# Patient Record
Sex: Male | Born: 1970 | Hispanic: No | Marital: Married | Smoking: Current some day smoker
Health system: Southern US, Community
[De-identification: ages and names within clinical notes are randomized; demographics above are authoritative.]

## PROBLEM LIST (undated history)

## (undated) DIAGNOSIS — M7660 Achilles tendinitis, unspecified leg: Secondary | ICD-10-CM

## (undated) DIAGNOSIS — M170 Bilateral primary osteoarthritis of knee: Secondary | ICD-10-CM

## (undated) DIAGNOSIS — M25539 Pain in unspecified wrist: Secondary | ICD-10-CM

## (undated) DIAGNOSIS — R000 Tachycardia, unspecified: Secondary | ICD-10-CM

## (undated) DIAGNOSIS — E559 Vitamin D deficiency, unspecified: Secondary | ICD-10-CM

## (undated) DIAGNOSIS — R7303 Prediabetes: Secondary | ICD-10-CM

## (undated) DIAGNOSIS — R7301 Impaired fasting glucose: Secondary | ICD-10-CM

## (undated) DIAGNOSIS — E663 Overweight: Secondary | ICD-10-CM

## (undated) DIAGNOSIS — E538 Deficiency of other specified B group vitamins: Secondary | ICD-10-CM

## (undated) DIAGNOSIS — N529 Male erectile dysfunction, unspecified: Secondary | ICD-10-CM

## (undated) DIAGNOSIS — T7840XA Allergy, unspecified, initial encounter: Secondary | ICD-10-CM

## (undated) DIAGNOSIS — E611 Iron deficiency: Secondary | ICD-10-CM

## (undated) DIAGNOSIS — Q5569 Other congenital malformation of penis: Secondary | ICD-10-CM

## (undated) DIAGNOSIS — J4520 Mild intermittent asthma, uncomplicated: Secondary | ICD-10-CM

## (undated) DIAGNOSIS — J302 Other seasonal allergic rhinitis: Secondary | ICD-10-CM

## (undated) DIAGNOSIS — M25511 Pain in right shoulder: Secondary | ICD-10-CM

## (undated) DIAGNOSIS — Z1211 Encounter for screening for malignant neoplasm of colon: Secondary | ICD-10-CM

## (undated) DIAGNOSIS — M1990 Unspecified osteoarthritis, unspecified site: Secondary | ICD-10-CM

## (undated) DIAGNOSIS — M6528 Calcific tendinitis, other site: Secondary | ICD-10-CM

## (undated) HISTORY — DX: Other seasonal allergic rhinitis: J30.2

## (undated) HISTORY — DX: Calcific tendinitis, other site: M65.28

## (undated) HISTORY — DX: Male erectile dysfunction, unspecified: N52.9

## (undated) HISTORY — DX: Impaired fasting glucose: R73.01

## (undated) HISTORY — DX: Pain in unspecified wrist: M25.539

## (undated) HISTORY — DX: Tachycardia, unspecified: R00.0

## (undated) HISTORY — DX: Bilateral primary osteoarthritis of knee: M17.0

## (undated) HISTORY — DX: Unspecified osteoarthritis, unspecified site: M19.90

## (undated) HISTORY — DX: Overweight: E66.3

## (undated) HISTORY — DX: Prediabetes: R73.03

## (undated) HISTORY — DX: Mild intermittent asthma, uncomplicated: J45.20

## (undated) HISTORY — DX: Vitamin D deficiency, unspecified: E55.9

## (undated) HISTORY — DX: Allergy, unspecified, initial encounter: T78.40XA

## (undated) HISTORY — DX: Encounter for screening for malignant neoplasm of colon: Z12.11

## (undated) HISTORY — DX: Other congenital malformation of penis: Q55.69

## (undated) HISTORY — DX: Deficiency of other specified B group vitamins: E53.8

## (undated) HISTORY — DX: Achilles tendinitis, unspecified leg: M76.60

## (undated) HISTORY — DX: Iron deficiency: E61.1

## (undated) HISTORY — DX: Pain in right shoulder: M25.511

---

## 2004-08-19 ENCOUNTER — Ambulatory Visit: Payer: Self-pay | Admitting: Family Medicine

## 2004-08-26 ENCOUNTER — Ambulatory Visit: Payer: Self-pay | Admitting: Family Medicine

## 2004-11-29 ENCOUNTER — Ambulatory Visit: Payer: Self-pay | Admitting: Family Medicine

## 2005-07-17 ENCOUNTER — Ambulatory Visit: Payer: Self-pay | Admitting: Family Medicine

## 2005-07-18 ENCOUNTER — Ambulatory Visit: Payer: Self-pay | Admitting: Family Medicine

## 2005-07-19 ENCOUNTER — Encounter: Admission: RE | Admit: 2005-07-19 | Discharge: 2005-07-19 | Payer: Self-pay | Admitting: Family Medicine

## 2005-07-19 ENCOUNTER — Ambulatory Visit: Payer: Self-pay | Admitting: Family Medicine

## 2005-07-28 ENCOUNTER — Ambulatory Visit: Payer: Self-pay | Admitting: Internal Medicine

## 2006-01-24 ENCOUNTER — Ambulatory Visit: Payer: Self-pay | Admitting: Family Medicine

## 2006-02-23 ENCOUNTER — Ambulatory Visit: Payer: Self-pay | Admitting: Family Medicine

## 2006-02-23 LAB — CONVERTED CEMR LAB
AST: 27 units/L (ref 0–37)
CO2: 28 meq/L (ref 19–32)
Calcium: 9.4 mg/dL (ref 8.4–10.5)
Chol/HDL Ratio, serum: 5
Cholesterol: 159 mg/dL (ref 0–200)
Creatinine, Ser: 1.1 mg/dL (ref 0.4–1.5)
Glomerular Filtration Rate, Af Am: 97 mL/min/{1.73_m2}
LDL Cholesterol: 103 mg/dL — ABNORMAL HIGH (ref 0–99)
MCV: 81.7 fL (ref 78.0–100.0)
Monocytes Absolute: 0.6 10*3/uL (ref 0.2–0.7)
Neutrophils Relative %: 56 % (ref 43.0–77.0)
Platelets: 207 10*3/uL (ref 150–400)
RBC: 5.22 M/uL (ref 4.22–5.81)
RDW: 12.5 % (ref 11.5–14.6)
Total Bilirubin: 1 mg/dL (ref 0.3–1.2)
Total Protein: 7 g/dL (ref 6.0–8.3)
Triglyceride fasting, serum: 121 mg/dL (ref 0–149)
VLDL: 24 mg/dL (ref 0–40)
WBC: 6.5 10*3/uL (ref 4.5–10.5)

## 2006-03-02 ENCOUNTER — Ambulatory Visit: Payer: Self-pay | Admitting: Family Medicine

## 2006-03-15 ENCOUNTER — Ambulatory Visit: Payer: Self-pay | Admitting: Family Medicine

## 2006-03-15 LAB — CONVERTED CEMR LAB
Testosterone Free: 60.7 pg/mL (ref 47.0–244.0)
Testosterone-% Free: 3.3 % — ABNORMAL HIGH (ref 1.6–2.9)

## 2006-06-18 ENCOUNTER — Ambulatory Visit: Payer: Self-pay | Admitting: Family Medicine

## 2006-09-10 ENCOUNTER — Ambulatory Visit: Payer: Self-pay | Admitting: Family Medicine

## 2006-09-11 LAB — CONVERTED CEMR LAB: Uric Acid, Serum: 7.8 mg/dL — ABNORMAL HIGH (ref 2.4–7.0)

## 2006-09-24 ENCOUNTER — Telehealth: Payer: Self-pay | Admitting: Family Medicine

## 2006-10-23 ENCOUNTER — Ambulatory Visit: Payer: Self-pay | Admitting: Family Medicine

## 2006-10-23 ENCOUNTER — Telehealth: Payer: Self-pay | Admitting: Family Medicine

## 2006-11-21 ENCOUNTER — Telehealth: Payer: Self-pay | Admitting: Family Medicine

## 2006-11-29 ENCOUNTER — Ambulatory Visit: Payer: Self-pay | Admitting: Family Medicine

## 2006-11-29 DIAGNOSIS — M109 Gout, unspecified: Secondary | ICD-10-CM | POA: Insufficient documentation

## 2008-03-23 ENCOUNTER — Ambulatory Visit: Payer: Self-pay | Admitting: Family Medicine

## 2008-07-01 ENCOUNTER — Ambulatory Visit: Payer: Self-pay | Admitting: Family Medicine

## 2010-12-12 ENCOUNTER — Telehealth: Payer: Self-pay | Admitting: Family Medicine

## 2010-12-12 NOTE — Telephone Encounter (Signed)
Patient would like to transfer care from [facility] to [facility] for [place]. Is that okay?

## 2010-12-12 NOTE — Telephone Encounter (Signed)
Fine with me--PM 

## 2010-12-14 ENCOUNTER — Ambulatory Visit: Payer: Self-pay | Admitting: Family Medicine

## 2010-12-15 ENCOUNTER — Encounter: Payer: Self-pay | Admitting: Family Medicine

## 2010-12-16 ENCOUNTER — Ambulatory Visit: Payer: Self-pay | Admitting: Family Medicine

## 2010-12-20 NOTE — Telephone Encounter (Signed)
0k

## 2010-12-22 NOTE — Telephone Encounter (Signed)
Pt has 12/23/10 appt scheduled

## 2010-12-23 ENCOUNTER — Ambulatory Visit: Payer: Self-pay | Admitting: Family Medicine

## 2011-01-06 ENCOUNTER — Encounter: Payer: Self-pay | Admitting: Family Medicine

## 2011-01-06 ENCOUNTER — Ambulatory Visit (INDEPENDENT_AMBULATORY_CARE_PROVIDER_SITE_OTHER): Payer: 59 | Admitting: Family Medicine

## 2011-01-06 DIAGNOSIS — M109 Gout, unspecified: Secondary | ICD-10-CM

## 2011-01-06 DIAGNOSIS — Q5569 Other congenital malformation of penis: Secondary | ICD-10-CM

## 2011-01-06 DIAGNOSIS — Z23 Encounter for immunization: Secondary | ICD-10-CM

## 2011-01-06 DIAGNOSIS — Z0000 Encounter for general adult medical examination without abnormal findings: Secondary | ICD-10-CM

## 2011-01-06 HISTORY — DX: Other congenital malformation of penis: Q55.69

## 2011-01-06 LAB — COMPREHENSIVE METABOLIC PANEL
ALT: 47 U/L (ref 0–53)
AST: 24 U/L (ref 0–37)
Calcium: 9.2 mg/dL (ref 8.4–10.5)
Chloride: 102 mEq/L (ref 96–112)
Creat: 0.95 mg/dL (ref 0.50–1.35)
Potassium: 3.9 mEq/L (ref 3.5–5.3)
Sodium: 139 mEq/L (ref 135–145)

## 2011-01-06 LAB — CBC WITH DIFFERENTIAL/PLATELET
Basophils Absolute: 0 10*3/uL (ref 0.0–0.1)
Eosinophils Absolute: 0.1 10*3/uL (ref 0.0–0.7)
Eosinophils Relative: 2 % (ref 0–5)
HCT: 43.1 % (ref 39.0–52.0)
Lymphs Abs: 2.4 10*3/uL (ref 0.7–4.0)
MCH: 27.9 pg (ref 26.0–34.0)
Monocytes Absolute: 0.6 10*3/uL (ref 0.1–1.0)
Monocytes Relative: 7 % (ref 3–12)
Platelets: 190 10*3/uL (ref 150–400)
WBC: 7.8 10*3/uL (ref 4.0–10.5)

## 2011-01-06 LAB — URIC ACID: Uric Acid, Serum: 8.3 mg/dL — ABNORMAL HIGH (ref 4.0–7.8)

## 2011-01-06 LAB — TSH: TSH: 1.477 u[IU]/mL (ref 0.350–4.500)

## 2011-01-06 MED ORDER — COLCHICINE 0.6 MG PO TABS: ORAL_TABLET | ORAL | Status: DC

## 2011-01-06 NOTE — Assessment & Plan Note (Signed)
Acute flare at 1st cuneiform and first metatarsal articulation. Colcrys 1.2mg  now, then 0.6 one hour later, then 0.6mg  bid x 2d. Call if this doesn't help.  Check uric acid level today. Not having attacks frequent enough to warrant prophylaxis at this point---discussed this with pt and he agrees. Gout diet info reviewed, handout given to pt.

## 2011-01-06 NOTE — Patient Instructions (Signed)
Health Maintenance, Males A healthy lifestyle and preventative care can promote health and wellness.  Maintain regular health, dental, and eye exams.   Eat a healthy diet. Foods like vegetables, fruits, whole grains, low-fat dairy products, and lean protein foods contain the nutrients you need without too many calories. Decrease your intake of foods high in solid fats, added sugars, and salt. Get information about a proper diet from your caregiver, if necessary.   Regular physical exercise is one of the most important things you can do for your health. Most adults should get at least 150 minutes of moderate-intensity exercise (any activity that increases your heart rate and causes you to sweat) each week. In addition, most adults need muscle-strengthening exercises on 2 or more days a week.    Maintain a healthy weight. The body mass index (BMI) is a screening tool to identify possible weight problems. It provides an estimate of body fat based on height and weight. Your caregiver can help determine your BMI, and can help you achieve or maintain a healthy weight. For adults 20 years and older:   A BMI below 18.5 is considered underweight.   A BMI of 18.5 to 24.9 is normal.   A BMI of 25 to 29.9 is considered overweight.   A BMI of 30 and above is considered obese.   Maintain normal blood lipids and cholesterol by exercising and minimizing your intake of saturated fat. Eat a balanced diet with plenty of fruits and vegetables. Blood tests for lipids and cholesterol should begin at age 20 and be repeated every 5 years. If your lipid or cholesterol levels are high, you are over 50, or you are a high risk for heart disease, you may need your cholesterol levels checked more frequently.Ongoing high lipid and cholesterol levels should be treated with medicines, if diet and exercise are not effective.   If you smoke, find out from your caregiver how to quit. If you do not use tobacco, do not start.    If you choose to drink alcohol, do not exceed 2 drinks per day. One drink is considered to be 12 ounces (355 mL) of beer, 5 ounces (148 mL) of wine, or 1.5 ounces (44 mL) of liquor.   Avoid use of street drugs. Do not share needles with anyone. Ask for help if you need support or instructions about stopping the use of drugs.   High blood pressure causes heart disease and increases the risk of stroke. Blood pressure should be checked at least every 1 to 2 years. Ongoing high blood pressure should be treated with medicines if weight loss and exercise are not effective.   If you are 45 to 41 years old, ask your caregiver if you should take aspirin to prevent heart disease.   Diabetes screening involves taking a blood sample to check your fasting blood sugar level. This should be done once every 3 years, after age 45, if you are within normal weight and without risk factors for diabetes. Testing should be considered at a younger age or be carried out more frequently if you are overweight and have at least 1 risk factor for diabetes.   Colorectal cancer can be detected and often prevented. Most routine colorectal cancer screening begins at the age of 50 and continues through age 75. However, your caregiver may recommend screening at an earlier age if you have risk factors for colon cancer. On a yearly basis, your caregiver may provide home test kits to check for hidden   blood in the stool. Use of a small camera at the end of a tube, to directly examine the colon (sigmoidoscopy or colonoscopy), can detect the earliest forms of colorectal cancer. Talk to your caregiver about this at age 50, when routine screening begins. Direct examination of the colon should be repeated every 5 to 10 years through age 75, unless early forms of pre-cancerous polyps or small growths are found.   Healthy men should no longer receive prostate-specific antigen (PSA) blood tests as part of routine cancer screening. Consult with  your caregiver about prostate cancer screening.   Practice safe sex. Use condoms and avoid high-risk sexual practices to reduce the spread of sexually transmitted infections (STIs).   Use sunscreen with a sun protection factor (SPF) of 30 or greater. Apply sunscreen liberally and repeatedly throughout the day. You should seek shade when your shadow is shorter than you. Protect yourself by wearing long sleeves, pants, a wide-brimmed hat, and sunglasses year round, whenever you are outdoors.   Notify your caregiver of new moles or changes in moles, especially if there is a change in shape or color. Also notify your caregiver if a mole is larger than the size of a pencil eraser.   A one-time screening for abdominal aortic aneurysm (AAA) and surgical repair of large AAAs by sound wave imaging (ultrasonography) is recommended for ages 65 to 75 years who are current or former smokers.   Stay current with your immunizations.  Document Released: 08/12/2007 Document Revised: 10/26/2010 Document Reviewed: 07/11/2010 Exit[employer] Patient Information 2012 [employer]. 

## 2011-01-06 NOTE — Progress Notes (Signed)
Office Note 01/06/2011  CC:  Chief Complaint  Patient presents with  . Establish Care    transfer, pt having pain in top of left foot since last night    HPI:  Johnny Atkins is a 40 y.o. [place] male who is here for CPE, transferred from Dr. Tawanna Cooler b/c of convenience to his home. Patient's most recent primary MD: Dr. Tawanna Cooler. Old records in [facility] EMR were reviewed prior to or during today's visit.  Acute complaint: <24h of pain focally on top of left foot, acute onset, severe last night and inhibited sleep some, a bit milder today. Swelling focally just a bit.  No hx of trauma recently.  No other joints bothering him.  No fever, no malaise, no myalgias. Reports hx of gouty arthritis x 2 episodes in about July 30, 2003 or 07/29/04, none since.  Was rx'd prophylactic gout med at that time but he says he never took it--"I don't like taking meds".     Past Medical History  Diagnosis Date  . Arthritis     Primarily knees, mild osteo  . Gout     2 attacks in right MTP joint around 30-Jul-2003  . Low testosterone     History reviewed. No pertinent past surgical history.  Family History  Problem Relation Age of Onset  . Arthritis Mother     osteo (no gout)  . Arthritis Father     osteo (no gout)    History   Social History  . Marital Status: Married    Spouse Name: N/A    Number of Children: N/A  . Years of Education: N/A   Occupational History  . Not on file.   Social History Main Topics  . Smoking status: Current Some Day Smoker    Types: Pipe, Cigars  . Smokeless tobacco: Former Neurosurgeon  . Alcohol Use: Yes  . Drug Use: No  . Sexually Active: Not on file   Other Topics Concern  . Not on file   Social History Narrative   Married, 2 daughters (3 y/o and 52 y/o).Occupation: Sport and exercise psychologist.  Got Master's degree in [place] and also one at [employer].Regular Exercise: 1-2 days per week (elliptical machine 30 min).No cigarettes but occasional cigar/pipe.  Two alcohol drinks per week (beer or  whisky).Born in [place], moved to [place] in [place]30[place]1994.  He is the youngest of 5 siblings.  All siblings healthy but one died in 1998-07-30 of a "stomach problem" rather suddenly.?BCG given in [place]?  Positive PPD here in 2003/07/30, CXR neg--no treatment.    Outpatient Encounter Prescriptions as of 01/06/2011  Medication Sig Dispense Refill  . NON FORMULARY EMERGEN-C powder, use as needed.       . colchicine (COLCRYS) 0.6 MG tablet 2 tabs po now (at onset of gout pain), then 1 tab po one hour later, then 1 tab po bid  7 tablet  2    No Known Allergies  ROS Review of Systems  Constitutional: Negative for fever, chills, appetite change and fatigue.  HENT: Negative for ear pain, congestion, sore throat, neck stiffness and dental problem.   Eyes: Negative for discharge, redness and visual disturbance.  Respiratory: Negative for cough, chest tightness, shortness of breath and wheezing.   Cardiovascular: Negative for chest pain, palpitations and leg swelling.  Gastrointestinal: Negative for nausea, vomiting, abdominal pain, diarrhea and blood in stool.  Genitourinary: Positive for penile pain (frenulum thickening/irritation on and off (uncircumcised)). Negative for dysuria, urgency, frequency, hematuria, flank pain, difficulty urinating and testicular  pain.  Musculoskeletal: Negative for myalgias, back pain, joint swelling and arthralgias.  Skin: Negative for pallor and rash.  Neurological: Negative for dizziness, speech difficulty, weakness and headaches.  Hematological: Negative for adenopathy. Does not bruise/bleed easily.  Psychiatric/Behavioral: Negative for confusion, sleep disturbance and dysphoric mood. The patient is not nervous/anxious.     PE; Blood pressure 125/85, pulse 97, temperature 98.4 F (36.9 C), temperature source Oral, height 5\' 8"  (1.727 m), weight 193 lb (87.544 kg), SpO2 97.00%. Gen: Alert, well appearing.  Patient is oriented to person, place, time, and situation. ENT: Ears: EACs clear,  normal epithelium.  TMs with good light reflex and landmarks bilaterally.  Eyes: no injection, icteris, swelling, or exudate.  EOMI, PERRLA. Nose: no drainage or turbinate edema/swelling.  No injection or focal lesion.  Mouth: lips without lesion/swelling.  Oral mucosa pink and moist.  Dentition intact and without obvious caries or gingival swelling.  Oropharynx without erythema, exudate, or swelling.  Neck - No masses or thyromegaly or limitation in range of motion Chest with symmetric expansion, good aeration, nonlabored respirations.  Clear and equal breath sounds in all lung fields.  No clubbing or cyanosis. RRR, without murmur, rub, or gallop. Carotid pulses easily palpable, symmetric pulsations, no bruit or palpable dilatation. Radial, femoral, and ankle pulses easily palpable and symmetric. No abdominal bruit. No varicosities.  Cap refill in extremities is brisk. ABD: soft, NT, ND, BS normal.  No hepatospenomegaly or mass.  No bruits. EXT: no clubbing, cyanosis, or edema.  Genitals normal; both testes normal without tenderness, masses, hydroceles, varicoceles, erythema or swelling. Shaft normal, uncircumcised, meatus normal without discharge. His ventral frenulum has mild thickening at its attachment to the base of the glans. No inguinal hernia noted. No inguinal lymphadenopathy.  Pertinent labs:  None today  ASSESSMENT AND PLAN:   GOUT Acute flare at 1st cuneiform and first metatarsal articulation. Colcrys 1.2mg  now, then 0.6 one hour later, then 0.6mg  bid x 2d. Call if this doesn't help.  Check uric acid level today. Not having attacks frequent enough to warrant prophylaxis at this point---discussed this with pt and he agrees. Gout diet info reviewed, handout given to pt.  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 Recommended he see urologist to discuss circumcision. He says he saw one a couple of years ago and circumcision was  recommended. We gave him the phone number of [facility] b/c they said he could simply call and make appt himself, no new referral needed.  Health maintenance examination His only issue is his weight (BMI 29).  We discussed getting more aggressive with exercise and diet, goal of 1 lb of wt loss per week, emphasized need to make sustainable lifestyle changes. Reviewed age and gender appropriate health maintenance issues (prudent diet, regular exercise, health risks of tobacco and excessive alcohol, use of seatbelts, fire alarms in home, use of sunscreen).  Also reviewed age and gender appropriate health screening as well as vaccine recommendations. Flu vaccine IM given today. He was not fasting today so we'll have him return at his convenience in the next couple of weeks to get FLP.     Return in about 1 year (around 01/06/2012) for cpe.

## 2011-01-06 NOTE — Assessment & Plan Note (Signed)
His only issue is his weight (BMI 29).  We discussed getting more aggressive with exercise and diet, goal of 1 lb of wt loss per week, emphasized need to make sustainable lifestyle changes. Reviewed age and gender appropriate health maintenance issues (prudent diet, regular exercise, health risks of tobacco and excessive alcohol, use of seatbelts, fire alarms in home, use of sunscreen).  Also reviewed age and gender appropriate health screening as well as vaccine recommendations. Flu vaccine IM given today. He was not fasting today so we'll have him return at his convenience in the next couple of weeks to get FLP.

## 2011-01-06 NOTE — Assessment & Plan Note (Signed)
He is not circumsized, his frenulum does bother him. Recommended he see urologist to discuss circumcision. He says he saw one a couple of years ago and circumcision was recommended. We gave him the phone number of [facility] b/c they said he could simply call and make appt himself, no new referral needed.

## 2011-01-11 ENCOUNTER — Other Ambulatory Visit (INDEPENDENT_AMBULATORY_CARE_PROVIDER_SITE_OTHER): Payer: 59

## 2011-01-11 DIAGNOSIS — Z0000 Encounter for general adult medical examination without abnormal findings: Secondary | ICD-10-CM

## 2011-01-11 LAB — LIPID PANEL
Cholesterol: 151 mg/dL (ref 0–200)
HDL: 36.2 mg/dL — ABNORMAL LOW (ref >39.00)
Total CHOL/HDL Ratio: 4
VLDL: 21.2 mg/dL (ref 0.0–40.0)

## 2011-02-24 ENCOUNTER — Ambulatory Visit (INDEPENDENT_AMBULATORY_CARE_PROVIDER_SITE_OTHER): Payer: 59 | Admitting: Family Medicine

## 2011-02-24 ENCOUNTER — Encounter: Payer: Self-pay | Admitting: Family Medicine

## 2011-02-24 VITALS — BP 119/83 | HR 99 | Temp 97.8°F | Wt 193.0 lb

## 2011-02-24 DIAGNOSIS — J40 Bronchitis, not specified as acute or chronic: Secondary | ICD-10-CM

## 2011-02-24 MED ORDER — HYDROCODONE-HOMATROPINE 5-1.5 MG/5ML PO SYRP: ORAL_SOLUTION | ORAL | Status: AC

## 2011-02-24 MED ORDER — ALBUTEROL SULFATE (2.5 MG/3ML) 0.083% IN NEBU
2.5000 mg | INHALATION_SOLUTION | Freq: Once | RESPIRATORY_TRACT | Status: AC
  Administered 2011-02-24: 2.5 mg via RESPIRATORY_TRACT

## 2011-02-24 MED ORDER — ALBUTEROL SULFATE HFA 108 (90 BASE) MCG/ACT IN AERS: INHALATION_SPRAY | RESPIRATORY_TRACT | Status: DC

## 2011-02-24 NOTE — Progress Notes (Signed)
OFFICE NOTE  02/24/2011  CC:  Chief Complaint  Patient presents with  . Cough    since Sunday, no fever, entire family sick     HPI: Patient is a 40 y.o. [place] male who is here for cough. Pt presents complaining of respiratory symptoms for 5-6 days.  Mostly nasal congestion/runny nose, sneezing, and PND cough.  Worst symptoms seems to be the cough, chest feeling tight and a bit harder to breath.  Lately the symptoms seem to be worsening.  No fevers, no wheezing.  No pain in face or teeth.  No significant HA.  No ST or HA.  Symptoms made worse by night time, supine position.  Symptoms improved by dimetapp. Smoker? no Recent sick contact? Yes, wife with similar illness Muscle or joint aches? Mild ( back)  ROS: no n/v/d or abdominal pain.  No rash.  No neck stiffness.   +Mild fatigue.  +Mild appetite loss.   Pertinent PMH:  Hx of asthmatic bronchitis x 1 in the past that he recalls having prednisone for. Gout No hx of smoking cigarettes.  Pertinent Meds: dimetapp, colchicine  PE: Blood pressure 119/83, pulse 99, temperature 97.8 F (36.6 C), temperature source Temporal, weight 193 lb (87.544 kg), SpO2 96.00%. VS: noted--normal. Gen: alert, NAD, NONTOXIC APPEARING. HEENT: eyes without injection, drainage, or swelling.  Ears: EACs clear, TMs with normal light reflex and landmarks.  Nose: Clear rhinorrhea, with some dried, crusty exudate adherent to mildly injected mucosa.  No purulent d/c.  No paranasal sinus TTP.  No facial swelling.  Throat and mouth without focal lesion.  No pharyngial swelling, erythema, or exudate.   Neck: supple, no LAD.   LUNGS: CTA bilat, nonlabored resps.  Frequent post-exhalational coughing.   CV: RRR, no m/r/g. EXT: no c/c/e SKIN: no rash    IMPRESSION AND PLAN: Viral URI with asthmatic bronchitis. Albuterol neb 2.5mg  in office today: improved aeration, less coughing.  Ventolin HFA 1-2 puffs q6h prn rx'd, also hycodan cough syrup 1-2 tsp q6h  prn cough.   Hold off on systemic steroids for now.  If not improving any in 3-4 days will likely add prednisone on at that time.  FOLLOW UP: prn

## 2011-07-08 ENCOUNTER — Ambulatory Visit: Payer: 59 | Admitting: Family Medicine

## 2011-07-10 ENCOUNTER — Encounter: Payer: Self-pay | Admitting: Family Medicine

## 2011-07-10 ENCOUNTER — Ambulatory Visit (INDEPENDENT_AMBULATORY_CARE_PROVIDER_SITE_OTHER): Payer: 59 | Admitting: Family Medicine

## 2011-07-10 VITALS — BP 126/85 | HR 76 | Temp 97.9°F | Ht 68.0 in | Wt 191.0 lb

## 2011-07-10 DIAGNOSIS — M109 Gout, unspecified: Secondary | ICD-10-CM

## 2011-07-10 MED ORDER — ALLOPURINOL 300 MG PO TABS: 300.0000 mg | ORAL_TABLET | Freq: Every day | ORAL | Status: DC

## 2011-07-10 MED ORDER — PREDNISONE 20 MG PO TABS: ORAL_TABLET | ORAL | Status: DC

## 2011-07-10 MED ORDER — COLCHICINE 0.6 MG PO TABS: ORAL_TABLET | ORAL | Status: DC

## 2011-07-10 NOTE — Progress Notes (Signed)
OFFICE NOTE  07/10/2011  CC:  Chief Complaint  Patient presents with  . Gout    flare on Friday, took colchicine over weekend.  still having pain.     HPI: Patient is a 41 y.o. [place] male who is here for joint pain/gout episode. Right foot great toe MTP started hurting, swelling, turned red--middle of night 2 nights ago. He took 2 colchicine then, then 1 colch an hour later, then the next day started 1 colch bid and this finished his 7 pill rx this morning.  He is feeling 50% better.  No f/c/malaise. He had an episode 38mo ago that the colchicine took care of fairly well--same joint. He is not currently on uric acid-lowering med but is ready to start one now.   Pertinent PMH:  Past Medical History  Diagnosis Date  . Arthritis     Primarily knees, mild osteo  . Gout     2 attacks in right MTP joint around 2005  . Low testosterone   . Wrist pain     For 2-3 years in his 30s.  Extensive multidisciplinary w/u at [facility] and [facility] revealed NO ABNORMALITY.  Improved with ibuprofen and essentially has spontaneously resolved.    MEDS:  Outpatient Prescriptions Prior to Visit  Medication Sig Dispense Refill  . albuterol (VENTOLIN HFA) 108 (90 BASE) MCG/ACT inhaler 1-2 puffs q6h prn cough/chest tightness/wheezing/shortness of breath  1 Inhaler  0  . colchicine (COLCRYS) 0.6 MG tablet 2 tabs po now (at onset of gout pain), then 1 tab po one hour later, then 1 tab po bid  7 tablet  2    PE: Blood pressure 126/85, pulse 76, temperature 97.9 F (36.6 C), temperature source Temporal, height 5\' 8"  (1.727 m), weight 191 lb (86.637 kg). Gen: Alert, well appearing.  Patient is oriented to person, place, time, and situation. Right foot 1st MTP joint erythematous, warm, tender.  IMPRESSION AND PLAN:  Podagra Acute flare: 50% better on th 7 tab "burst" of colchicine over the w/e. This is getting more frequent so we'll start allopurinol today. Will start allopurinol 300mg  qd, also continue  colchicine 0.6mg  bid for 38mo during the start-up period of allopurinol. Gave prednisone 20mg , 1 tab bid x 5d --rx to hold for future if acute flare comes on again and it is w/e or the colchicine has not been effective in the "flare" dosing. F/u in office in 1 mo to see how he's doing and will recheck uric acid again at that time. We have gone over diet in the past, reviewed again--maybe alcohol consumption this w/e played a role.  He is a vegetarian and does eat a lot of beans as protein source.       FOLLOW UP: 38mo

## 2011-07-10 NOTE — Assessment & Plan Note (Signed)
Acute flare: 50% better on th 7 tab "burst" of colchicine over the w/e. This is getting more frequent so we'll start allopurinol today. Will start allopurinol 300mg  qd, also continue colchicine 0.6mg  bid for 23mo during the start-up period of allopurinol. Gave prednisone 20mg , 1 tab bid x 5d --rx to hold for future if acute flare comes on again and it is w/e or the colchicine has not been effective in the "flare" dosing. F/u in office in 1 mo to see how he's doing and will recheck uric acid again at that time. We have gone over diet in the past, reviewed again--maybe alcohol consumption this w/e played a role.  He is a vegetarian and does eat a lot of beans as protein source.

## 2012-01-11 ENCOUNTER — Encounter: Payer: Self-pay | Admitting: Family Medicine

## 2012-01-12 ENCOUNTER — Ambulatory Visit (INDEPENDENT_AMBULATORY_CARE_PROVIDER_SITE_OTHER): Payer: 59 | Admitting: Family Medicine

## 2012-01-12 ENCOUNTER — Encounter: Payer: Self-pay | Admitting: Family Medicine

## 2012-01-12 VITALS — BP 130/88 | HR 91 | Temp 97.4°F | Ht 68.0 in | Wt 194.0 lb

## 2012-01-12 DIAGNOSIS — Z0000 Encounter for general adult medical examination without abnormal findings: Secondary | ICD-10-CM

## 2012-01-12 DIAGNOSIS — J069 Acute upper respiratory infection, unspecified: Secondary | ICD-10-CM | POA: Insufficient documentation

## 2012-01-12 DIAGNOSIS — M109 Gout, unspecified: Secondary | ICD-10-CM

## 2012-01-12 MED ORDER — HYDROCODONE-HOMATROPINE 5-1.5 MG/5ML PO SYRP: ORAL_SOLUTION | ORAL | Status: DC

## 2012-01-12 MED ORDER — BENZONATATE 200 MG PO CAPS: 200.0000 mg | ORAL_CAPSULE | Freq: Two times a day (BID) | ORAL | Status: DC | PRN

## 2012-01-12 NOTE — Assessment & Plan Note (Signed)
Discussed symptomatic care and pt says he tolerates zyrtec but really nothing else OTC as far as cold/cough, so I agreed to call in tessalon perles 200mg  bid prn for daytime cough--take zyrtec with this. I also rx'd hycodan for bedtime use for cough.  Discussed warning signs of RAD and worsening resp illness and he'll call or return if any of these occur.

## 2012-01-12 NOTE — Assessment & Plan Note (Addendum)
Reviewed age and gender appropriate health maintenance issues (prudent diet, regular exercise, health risks of tobacco and excessive alcohol, use of seatbelts, fire alarms in home, use of sunscreen).  Also reviewed age and gender appropriate health screening as well as vaccine recommendations. Flu vaccine IM today.  He'll return for fasting health panel and uric acid level ASAP. Discussed wt management he'll work on doing more vigorous exercise for a longer period of time.  He'll also work some on decreasing carbs, increasing veggies and fiber-rich fruits.  Goal of 1 lb wt loss per week, goal final wt of around 175 lbs.  Offered nutritionist referral but he declined.

## 2012-01-12 NOTE — Patient Instructions (Signed)
Health Maintenance, Males A healthy lifestyle and preventative care can promote health and wellness.  Maintain regular health, dental, and eye exams.  Eat a healthy diet. Foods like vegetables, fruits, whole grains, low-fat dairy products, and lean protein foods contain the nutrients you need without too many calories. Decrease your intake of foods high in solid fats, added sugars, and salt. Get information about a proper diet from your caregiver, if necessary.  Regular physical exercise is one of the most important things you can do for your health. Most adults should get at least 150 minutes of moderate-intensity exercise (any activity that increases your heart rate and causes you to sweat) each week. In addition, most adults need muscle-strengthening exercises on 2 or more days a week.   Maintain a healthy weight. The body mass index (BMI) is a screening tool to identify possible weight problems. It provides an estimate of body fat based on height and weight. Your caregiver can help determine your BMI, and can help you achieve or maintain a healthy weight. For adults 20 years and older:  A BMI below 18.5 is considered underweight.  A BMI of 18.5 to 24.9 is normal.  A BMI of 25 to 29.9 is considered overweight.  A BMI of 30 and above is considered obese.  Maintain normal blood lipids and cholesterol by exercising and minimizing your intake of saturated fat. Eat a balanced diet with plenty of fruits and vegetables. Blood tests for lipids and cholesterol should begin at age 20 and be repeated every 5 years. If your lipid or cholesterol levels are high, you are over 50, or you are a high risk for heart disease, you may need your cholesterol levels checked more frequently.Ongoing high lipid and cholesterol levels should be treated with medicines, if diet and exercise are not effective.  If you smoke, find out from your caregiver how to quit. If you do not use tobacco, do not start.  If you  choose to drink alcohol, do not exceed 2 drinks per day. One drink is considered to be 12 ounces (355 mL) of beer, 5 ounces (148 mL) of wine, or 1.5 ounces (44 mL) of liquor.  Avoid use of street drugs. Do not share needles with anyone. Ask for help if you need support or instructions about stopping the use of drugs.  High blood pressure causes heart disease and increases the risk of stroke. Blood pressure should be checked at least every 1 to 2 years. Ongoing high blood pressure should be treated with medicines if weight loss and exercise are not effective.  If you are 45 to 42 years old, ask your caregiver if you should take aspirin to prevent heart disease.  Diabetes screening involves taking a blood sample to check your fasting blood sugar level. This should be done once every 3 years, after age 45, if you are within normal weight and without risk factors for diabetes. Testing should be considered at a younger age or be carried out more frequently if you are overweight and have at least 1 risk factor for diabetes.  Colorectal cancer can be detected and often prevented. Most routine colorectal cancer screening begins at the age of 50 and continues through age 75. However, your caregiver may recommend screening at an earlier age if you have risk factors for colon cancer. On a yearly basis, your caregiver may provide home test kits to check for hidden blood in the stool. Use of a small camera at the end of a tube,   to directly examine the colon (sigmoidoscopy or colonoscopy), can detect the earliest forms of colorectal cancer. Talk to your caregiver about this at age 50, when routine screening begins. Direct examination of the colon should be repeated every 5 to 10 years through age 75, unless early forms of pre-cancerous polyps or small growths are found.  Hepatitis C blood testing is recommended for all people born from 1945 through 1965 and any individual with known risks for hepatitis C.  Healthy  men should no longer receive prostate-specific antigen (PSA) blood tests as part of routine cancer screening. Consult with your caregiver about prostate cancer screening.  Testicular cancer screening is not recommended for adolescents or adult males who have no symptoms. Screening includes self-exam, caregiver exam, and other screening tests. Consult with your caregiver about any symptoms you have or any concerns you have about testicular cancer.  Practice safe sex. Use condoms and avoid high-risk sexual practices to reduce the spread of sexually transmitted infections (STIs).  Use sunscreen with a sun protection factor (SPF) of 30 or greater. Apply sunscreen liberally and repeatedly throughout the day. You should seek shade when your shadow is shorter than you. Protect yourself by wearing long sleeves, pants, a wide-brimmed hat, and sunglasses year round, whenever you are outdoors.  Notify your caregiver of new moles or changes in moles, especially if there is a change in shape or color. Also notify your caregiver if a mole is larger than the size of a pencil eraser.  A one-time screening for abdominal aortic aneurysm (AAA) and surgical repair of large AAAs by sound wave imaging (ultrasonography) is recommended for ages 65 to 75 years who are current or former smokers.  Stay current with your immunizations. Document Released: 08/12/2007 Document Revised: 05/08/2011 Document Reviewed: 07/11/2010 [employer] Patient Information 2013 [employer].  

## 2012-01-12 NOTE — Assessment & Plan Note (Signed)
Question of seasonal burst in flares (summer) for him. If recheck of uric acid comes back still >8 then will have him restart allopurinol now. If <8 then will go with the plan of restarting allopurinol in the spring and continue through early fall.

## 2012-01-12 NOTE — Progress Notes (Signed)
Office Note 01/12/2012  CC:  Chief Complaint  Patient presents with  . Annual Exam    head congestion    HPI:  Johnny Atkins is a 41 y.o. [place] male who is here for CPE. Last CPE was one year ago.  Reports having three gout flares this summer, then he stopped drinking beer.  He continued to drink a couple of mixed drinks every weekend, however.  He says he has noted this trend of increase in gout flares in summer, then rare episode any other time of year.  He stopped taking the allopurinol b/c he doesn't like taking a med regularly and was feeling ok, etc.  Also, has had 2d of nasal congestion/runny nose, scratchy throat, PND cough.  Sometimes the cough gets going at night and impairs sleep.  Denies fever or wheezing or chest tightness.  Has hx of requiring albuterol inhaler for acute asthmatic bronchitis in the past but denies feeling like he needs this lately.  Past Medical History  Diagnosis Date  . Arthritis     Primarily knees, mild osteo  . Gout     2 attacks in right MTP joint around August 12, 2003; 2 more flares in foot/toe 2011-08-12  . Low testosterone   . Wrist pain     For 2-3 years in his 30s.  Extensive multidisciplinary w/u at [facility] and [facility] revealed NO ABNORMALITY.  Improved with ibuprofen and essentially has spontaneously resolved.  . 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 Overweight (BMI 25.0-29.9)     History reviewed. No pertinent past surgical history.  Family History  Problem Relation Age of Onset  . Arthritis Mother     osteo (no gout)  . Arthritis Father     osteo (no gout)    History   Social History  . Marital Status: Married    Spouse Name: N/A    Number of Children: N/A  . Years of Education: N/A   Occupational History  . Not on file.   Social History Main Topics  . Smoking status: Current Some Day Smoker    Types: Pipe, Cigars  . Smokeless tobacco: Never Used  . Alcohol Use: Yes  . Drug Use: No  . Sexually Active: Not on file   Other  Topics Concern  . Not on file   Social History Narrative   Married, 2 daughters (3 y/o and 34 y/o).Occupation: Sport and exercise psychologist.  Got Master's degree in [place] and also one at [employer].Regular Exercise: 1-2 days per week (elliptical machine 30 min).  He is a vegetarian (tends to eat lots of lentils, bread, rice).  No cigarettes but occasional cigar/pipe.  Two alcohol drinks per week (beer or whisky).Born in [place], moved to [place] in 1994/08[place]12.  He is the youngest of 5 siblings.  All siblings healthy but one died in 1998/08/12 of a "stomach problem" rather suddenly.?BCG given in [place]?  Positive PPD here in 08-12-2003, CXR neg--no treatment.    Outpatient Encounter Prescriptions as of 01/12/2012  Medication Sig Dispense Refill  . albuterol (VENTOLIN HFA) 108 (90 BASE) MCG/ACT inhaler 1-2 puffs q6h prn cough/chest tightness/wheezing/shortness of breath  1 Inhaler  0  . allopurinol (ZYLOPRIM) 300 MG tablet Take 1 tablet (300 mg total) by mouth daily.  30 tablet  6  . benzonatate (TESSALON) 200 MG capsule Take 1 capsule (200 mg total) by mouth 2 (two) times daily as needed for cough.  20 capsule  0  . colchicine (COLCRYS) 0.6 MG tablet 1 tab po bid  60 tablet  1  . HYDROcodone-homatropine (HYCODAN) 5-1.5 MG/5ML syrup 1-2 tsp po qhs prn cough  120 mL  0  . [DISCONTINUED] predniSONE (DELTASONE) 20 MG tablet 2 tabs po qd x 5d  10 tablet  0    No Known Allergies  ROS Review of Systems  Constitutional: Negative for fever, chills, appetite change and fatigue.  HENT: Positive for congestion, rhinorrhea and postnasal drip. Negative for ear pain, sore throat, neck stiffness and dental problem.   Eyes: Negative for discharge, redness and visual disturbance.  Respiratory: Positive for cough. Negative for chest tightness, shortness of breath and wheezing.   Cardiovascular: Negative for chest pain, palpitations and leg swelling.  Gastrointestinal: Negative for nausea, vomiting, abdominal pain, diarrhea and blood in stool.    Genitourinary: Negative for dysuria, urgency, frequency, hematuria, flank pain and difficulty urinating.  Musculoskeletal: Negative for myalgias, back pain, joint swelling and arthralgias.  Skin: Negative for pallor and rash.  Neurological: Negative for dizziness, speech difficulty, weakness and headaches.  Hematological: Negative for adenopathy. Does not bruise/bleed easily.  Psychiatric/Behavioral: Negative for confusion and sleep disturbance. The patient is not nervous/anxious.     PE; Blood pressure 130/88, pulse 91, temperature 97.4 F (36.3 C), temperature source Temporal, height 5\' 8"  (1.727 m), weight 194 lb (87.998 kg). Gen: Alert, well appearing.  Patient is oriented to person, place, time, and situation. AFFECT: pleasant, lucid thought and speech. ENT: Ears: EACs clear, normal epithelium.  TMs with good light reflex and landmarks bilaterally.  Eyes: no injection, icteris, swelling, or exudate.  EOMI, PERRLA. Nose: no drainage.  Minimal turbinate edema, minimal crusty mucous adherent to nasal mucosa. No injection or focal lesion.  Mouth: lips without lesion/swelling.  Oral mucosa pink and moist.  Dentition intact and without obvious caries or gingival swelling.  Oropharynx without erythema, exudate, or swelling.  Neck: supple/nontender.  No LAD, mass, or TM.  Carotid pulses 2+ bilaterally, without bruits. CV: RRR, no m/r/g.   LUNGS: CTA bilat, nonlabored resps, good aeration in all lung fields. ABD: soft, NT, ND, BS normal.  No hepatospenomegaly or mass.  No bruits. EXT: no clubbing, cyanosis, or edema.  Musculoskeletal: no joint swelling, erythema, warmth, or tenderness.  ROM of all joints intact. Skin - no sores or suspicious lesions or rashes or color changes Genitals normal; both testes normal without tenderness, masses, hydroceles, varicoceles, erythema or swelling. Shaft normal, uncircumcised, meatus normal without discharge. No inguinal hernia noted. No inguinal  lymphadenopathy.  Pertinent labs:  None today  ASSESSMENT AND PLAN:   GOUT Question of seasonal burst in flares (summer) for him. If recheck of uric acid comes back still >8 then will have him restart allopurinol now. If <8 then will go with the plan of restarting allopurinol in the spring and continue through early fall.  Health maintenance examination Reviewed age and gender appropriate health maintenance issues (prudent diet, regular exercise, health risks of tobacco and excessive alcohol, use of seatbelts, fire alarms in home, use of sunscreen).  Also reviewed age and gender appropriate health screening as well as vaccine recommendations. Flu vaccine IM today.  He'll return for fasting health panel and uric acid level ASAP. Discussed wt management he'll work on doing more vigorous exercise for a longer period of time.  He'll also work some on decreasing carbs, increasing veggies and fiber-rich fruits.  Goal of 1 lb wt loss per week, goal final wt of around 175 lbs.  Offered nutritionist referral but he declined.  Viral URI Discussed  symptomatic care and pt says he tolerates zyrtec but really nothing else OTC as far as cold/cough, so I agreed to call in tessalon perles 200mg  bid prn for daytime cough--take zyrtec with this. I also rx'd hycodan for bedtime use for cough.  Discussed warning signs of RAD and worsening resp illness and he'll call or return if any of these occur.      Return in about 1 year (around 01/11/2013) for CPE.

## 2012-01-15 ENCOUNTER — Other Ambulatory Visit (INDEPENDENT_AMBULATORY_CARE_PROVIDER_SITE_OTHER): Payer: 59

## 2012-01-15 DIAGNOSIS — Z0000 Encounter for general adult medical examination without abnormal findings: Secondary | ICD-10-CM

## 2012-01-15 DIAGNOSIS — M109 Gout, unspecified: Secondary | ICD-10-CM

## 2012-01-15 DIAGNOSIS — R7401 Elevation of levels of liver transaminase levels: Secondary | ICD-10-CM

## 2012-01-15 LAB — CBC WITH DIFFERENTIAL/PLATELET
Basophils Relative: 0.4 % (ref 0.0–3.0)
Eosinophils Relative: 4 % (ref 0.0–5.0)
HCT: 42.4 % (ref 39.0–52.0)
Hemoglobin: 13.9 g/dL (ref 13.0–17.0)
Lymphocytes Relative: 23.4 % (ref 12.0–46.0)
Lymphs Abs: 1.8 10*3/uL (ref 0.7–4.0)
Neutrophils Relative %: 64.5 % (ref 43.0–77.0)

## 2012-01-15 LAB — LIPID PANEL
Cholesterol: 152 mg/dL (ref 0–200)
Total CHOL/HDL Ratio: 4
Triglycerides: 125 mg/dL (ref 0.0–149.0)
VLDL: 25 mg/dL (ref 0.0–40.0)

## 2012-01-15 LAB — COMPREHENSIVE METABOLIC PANEL
ALT: 46 U/L (ref 0–53)
AST: 29 U/L (ref 0–37)
BUN: 13 mg/dL (ref 6–23)
Calcium: 8.9 mg/dL (ref 8.4–10.5)
Chloride: 106 mEq/L (ref 96–112)
Creatinine, Ser: 1 mg/dL (ref 0.4–1.5)
Glucose, Bld: 107 mg/dL — ABNORMAL HIGH (ref 70–99)
Total Protein: 7.3 g/dL (ref 6.0–8.3)

## 2012-01-15 LAB — URIC ACID: Uric Acid, Serum: 10.2 mg/dL — ABNORMAL HIGH (ref 4.0–7.8)

## 2012-01-16 ENCOUNTER — Other Ambulatory Visit: Payer: Self-pay | Admitting: Family Medicine

## 2012-01-16 LAB — HEPATITIS B SURFACE ANTIGEN: Hepatitis B Surface Ag: NEGATIVE

## 2012-01-16 LAB — HEPATITIS C ANTIBODY: HCV Ab: NEGATIVE

## 2012-01-16 MED ORDER — ALLOPURINOL 300 MG PO TABS: 300.0000 mg | ORAL_TABLET | Freq: Every day | ORAL | Status: DC

## 2012-01-17 NOTE — Telephone Encounter (Signed)
eScribe request for refill on COLCRYS Last filled - 06/2011 Last seen on - 01/12/12 Refill sent per [facility] refill protocol.

## 2012-04-13 ENCOUNTER — Other Ambulatory Visit: Payer: Self-pay

## 2012-05-07 ENCOUNTER — Ambulatory Visit (INDEPENDENT_AMBULATORY_CARE_PROVIDER_SITE_OTHER)
Admission: RE | Admit: 2012-05-07 | Discharge: 2012-05-07 | Disposition: A | Discharge status: Home / Self Care | Payer: 59 | Source: Ambulatory Visit | Attending: Internal Medicine | Admitting: Internal Medicine

## 2012-05-07 ENCOUNTER — Ambulatory Visit (INDEPENDENT_AMBULATORY_CARE_PROVIDER_SITE_OTHER): Payer: 59 | Admitting: Internal Medicine

## 2012-05-07 ENCOUNTER — Encounter: Payer: Self-pay | Admitting: Internal Medicine

## 2012-05-07 ENCOUNTER — Ambulatory Visit: Payer: 59 | Admitting: Internal Medicine

## 2012-05-07 VITALS — BP 128/76 | HR 80 | Temp 97.9°F | Resp 16 | Wt 192.0 lb

## 2012-05-07 DIAGNOSIS — R05 Cough: Secondary | ICD-10-CM

## 2012-05-07 DIAGNOSIS — R059 Cough, unspecified: Secondary | ICD-10-CM

## 2012-05-07 DIAGNOSIS — M109 Gout, unspecified: Secondary | ICD-10-CM

## 2012-05-07 DIAGNOSIS — J45909 Unspecified asthma, uncomplicated: Secondary | ICD-10-CM

## 2012-05-07 MED ORDER — CEFUROXIME AXETIL 500 MG PO TABS: 500.0000 mg | ORAL_TABLET | Freq: Two times a day (BID) | ORAL | Status: DC

## 2012-05-07 MED ORDER — HYDROCODONE-HOMATROPINE 5-1.5 MG/5ML PO SYRP: ORAL_SOLUTION | ORAL | Status: DC

## 2012-05-07 MED ORDER — FLUTICASONE-SALMETEROL 100-50 MCG/DOSE IN AEPB: 1.0000 | INHALATION_SPRAY | Freq: Two times a day (BID) | RESPIRATORY_TRACT | Status: DC

## 2012-05-07 MED ORDER — ALBUTEROL SULFATE HFA 108 (90 BASE) MCG/ACT IN AERS: 2.0000 | INHALATION_SPRAY | RESPIRATORY_TRACT | Status: DC | PRN

## 2012-05-07 MED ORDER — BENZONATATE 200 MG PO CAPS: 200.0000 mg | ORAL_CAPSULE | Freq: Three times a day (TID) | ORAL | Status: DC | PRN

## 2012-05-07 NOTE — Progress Notes (Signed)
  Subjective:    Patient ID: Johnny Atkins, male    DOB: 1970-09-28, 42 y.o.   MRN: [id number]  Cough This is a new problem. The current episode started 1 to 4 weeks ago (4 wks). The problem has been waxing and waning. The problem occurs every few minutes. The cough is productive of brown sputum. Associated symptoms include chest pain, a sore throat, shortness of breath and wheezing. Pertinent negatives include no rash. Exacerbated by: talking. The treatment provided mild relief. There is no history of asthma.      Review of Systems  Constitutional: Negative for appetite change, fatigue and unexpected weight change.  HENT: Positive for sore throat. Negative for nosebleeds, congestion, sneezing, trouble swallowing and neck pain.   Eyes: Negative for itching and visual disturbance.  Respiratory: Positive for cough, shortness of breath and wheezing.   Cardiovascular: Positive for chest pain. Negative for palpitations and leg swelling.  Gastrointestinal: Negative for nausea, diarrhea, blood in stool and abdominal distention.  Genitourinary: Negative for frequency and hematuria.  Musculoskeletal: Negative for back pain, joint swelling and gait problem.  Skin: Negative for rash.  Neurological: Negative for dizziness, tremors, speech difficulty and weakness.  Psychiatric/Behavioral: Negative for sleep disturbance, dysphoric mood and agitation. The patient is not nervous/anxious.        Objective:   Physical Exam  Constitutional: He is oriented to person, place, and time. He appears well-developed. No distress.  HENT:  eryth throat  Eyes: Conjunctivae are normal. Pupils are equal, round, and reactive to light.  Neck: Normal range of motion. No JVD present. No thyromegaly present.  Cardiovascular: Normal rate, regular rhythm, normal heart sounds and intact distal pulses.  Exam reveals no gallop and no friction rub.   No murmur heard. Pulmonary/Chest: Effort normal and breath sounds normal. No  respiratory distress. He has no wheezes. He has no rales. He exhibits no tenderness.  Abdominal: Soft. Bowel sounds are normal. He exhibits no distension and no mass. There is no tenderness. There is no rebound and no guarding.  Musculoskeletal: Normal range of motion. He exhibits no edema and no tenderness.  Lymphadenopathy:    He has no cervical adenopathy.  Neurological: He is alert and oriented to person, place, and time. He has normal reflexes. No cranial nerve deficit. He exhibits normal muscle tone. Coordination normal.  Skin: Skin is warm and dry. No rash noted.  Psychiatric: He has a normal mood and affect. His behavior is normal. Judgment and thought content normal.    I personally provided the Advair inhaler use teaching. After the teaching patient was able to demonstrate it's use effectively. All questions were answered       Assessment & Plan:

## 2012-05-07 NOTE — Assessment & Plan Note (Signed)
CXR

## 2012-05-08 ENCOUNTER — Encounter: Payer: Self-pay | Admitting: Internal Medicine

## 2012-05-08 DIAGNOSIS — J45909 Unspecified asthma, uncomplicated: Secondary | ICD-10-CM | POA: Insufficient documentation

## 2012-05-08 NOTE — Assessment & Plan Note (Addendum)
Recurrent - exacerbates w/every URI Declined Depomedrol Start Advair Ceftin Rx Cont Ventolin prn F/u w/Dr Milinda Cave

## 2012-05-08 NOTE — Assessment & Plan Note (Signed)
No relapse 

## 2012-05-22 ENCOUNTER — Ambulatory Visit: Payer: 59 | Admitting: Family Medicine

## 2012-05-22 ENCOUNTER — Ambulatory Visit (INDEPENDENT_AMBULATORY_CARE_PROVIDER_SITE_OTHER): Payer: 59 | Admitting: Family Medicine

## 2012-05-22 ENCOUNTER — Encounter: Payer: Self-pay | Admitting: Family Medicine

## 2012-05-22 VITALS — BP 136/88 | HR 99 | Temp 97.2°F | Resp 14 | Wt 191.0 lb

## 2012-05-22 DIAGNOSIS — J069 Acute upper respiratory infection, unspecified: Secondary | ICD-10-CM

## 2012-05-22 NOTE — Progress Notes (Signed)
OFFICE NOTE  05/22/2012  CC:  Chief Complaint  Patient presents with  . Cough     HPI: Patient is a 42 y.o. [place] male who is here for respiratory complaints. Pt reports onset of this illness a few weeks ago, and initially he went to Dr. Posey Rea b/c I was unavailable.  He was treated with abx and advair and a CXR was normal.  He was offered systemic steroids but he declined.  He was rx'd ventolin but did not pick this rx up.  Currently he says he is much improved, but still has some lingering nasal congestion/runny nose with occasional deep/tight cough that is nonproductive.  No sob or wheezing, no fevers, no face pain or HA, no ST. Unfortunately, his father has had a stroke and he still lives in [place].  Pt is catching a plane this afternoon to go to [place] and he wanted to get checked out before he left the country.  Additional ROS: no n/v/d or abdominal pain.  No rash.  No neck stiffness.   +Mild fatigue.  +Mild appetite loss.   Pertinent PMH:  Past Medical History  Diagnosis Date  . Arthritis     Primarily knees, mild osteo  . Gout     2 attacks in right MTP joint around 2005; 2 more flares in foot/toe 2013  . Low testosterone   . Wrist pain     For 2-3 years in his 30s.  Extensive multidisciplinary w/u at [facility] and [facility] revealed NO ABNORMALITY.  Improved with ibuprofen and essentially has spontaneously resolved.  . 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 Overweight (BMI 25.0-29.9)     MEDS:  Outpatient Prescriptions Prior to Visit  Medication Sig Dispense Refill  . albuterol (VENTOLIN HFA) 108 (90 BASE) MCG/ACT inhaler Inhale 2 puffs into the lungs every 4 (four) hours as needed for wheezing or shortness of breath. 1-2 puffs q6h prn cough/chest tightness/wheezing/shortness of breath  1 Inhaler  3  . allopurinol (ZYLOPRIM) 300 MG tablet Take 1 tablet (300 mg total) by mouth daily.  30 tablet  6  . benzonatate (TESSALON) 200 MG capsule Take 1 capsule (200 mg total) by mouth  3 (three) times daily as needed for cough.  20 capsule  0  . cefUROXime (CEFTIN) 500 MG tablet Take 1 tablet (500 mg total) by mouth 2 (two) times daily.  20 tablet  0  . COLCRYS 0.6 MG tablet TAKE 1 TABLET BY MOUTH TWICE A DAY  60 tablet  1  . Fluticasone-Salmeterol (ADVAIR) 100-50 MCG/DOSE AEPB Inhale 1 puff into the lungs 2 (two) times daily.  1 each  4  . HYDROcodone-homatropine (HYCODAN) 5-1.5 MG/5ML syrup 1-2 tsp po qhs prn cough  240 mL  0   No facility-administered medications prior to visit.    PE: Blood pressure 136/88, pulse 99, temperature 97.2 F (36.2 C), temperature source Temporal, resp. rate 14, weight 191 lb (86.637 kg), SpO2 98.00%. VS: noted--normal. Gen: alert, NAD, WELL-APPEARING. HEENT: eyes without injection, drainage, or swelling.  Ears: EACs clear, TMs with normal light reflex and landmarks.  Nose: Clear rhinorrhea, with some dried, crusty exudate adherent to mildly injected mucosa.  No purulent d/c.  No paranasal sinus TTP.  No facial swelling.  Throat and mouth without focal lesion.  No pharyngial swelling, erythema, or exudate.   Neck: supple, no LAD.   LUNGS: CTA bilat, nonlabored resps.  With a very forceful expiration he has post-expiration coughing but not for more than a few  seconds. CV: RRR, no m/r/g. EXT: no c/c/e SKIN: no rash  IMPRESSION AND PLAN:  Viral URI Viral URI with mild RAD/bronchospasm---resolving appropriately. I gave him a sample of symbicort 80/4.5 to take 2 puffs bid x 14d. We discussed use of saline nasal spray 2-3 times a day for his nasal symptoms. Expect gradual resolution of symptoms over the next 1-2 wks. Signs/symptoms to call or return for were reviewed and pt expressed understanding.Marland Kitchen    An After Visit Summary was printed and given to the patient.  FOLLOW UP: prn

## 2012-05-22 NOTE — Assessment & Plan Note (Signed)
Viral URI with mild RAD/bronchospasm---resolving appropriately. I gave him a sample of symbicort 80/4.5 to take 2 puffs bid x 14d. We discussed use of saline nasal spray 2-3 times a day for his nasal symptoms. Expect gradual resolution of symptoms over the next 1-2 wks. Signs/symptoms to call or return for were reviewed and pt expressed understanding.Marland Kitchen

## 2012-06-03 ENCOUNTER — Telehealth: Payer: Self-pay | Admitting: Family Medicine

## 2012-06-03 NOTE — Telephone Encounter (Signed)
Phone call completed in seperated phone note with same date.

## 2012-06-03 NOTE — Telephone Encounter (Signed)
Pt scheduled for tomorrow at 11:15am to see Dr. Milinda Cave.

## 2012-06-03 NOTE — Telephone Encounter (Signed)
RN attempted to call patient back.  No answer. Voicemail was left on machine.

## 2012-06-04 ENCOUNTER — Ambulatory Visit (INDEPENDENT_AMBULATORY_CARE_PROVIDER_SITE_OTHER): Payer: 59 | Admitting: Family Medicine

## 2012-06-04 ENCOUNTER — Encounter: Payer: Self-pay | Admitting: Family Medicine

## 2012-06-04 VITALS — BP 130/92 | HR 92 | Temp 98.8°F | Ht 68.0 in | Wt 187.0 lb

## 2012-06-04 DIAGNOSIS — J45909 Unspecified asthma, uncomplicated: Secondary | ICD-10-CM

## 2012-06-04 DIAGNOSIS — Z634 Disappearance and death of family member: Secondary | ICD-10-CM | POA: Insufficient documentation

## 2012-06-04 DIAGNOSIS — F4320 Adjustment disorder, unspecified: Secondary | ICD-10-CM

## 2012-06-04 MED ORDER — PREDNISONE 20 MG PO TABS: ORAL_TABLET | ORAL | Status: DC

## 2012-06-04 MED ORDER — CLONAZEPAM 1 MG PO TABS: ORAL_TABLET | ORAL | Status: DC

## 2012-06-04 NOTE — Assessment & Plan Note (Signed)
I gave rx for clonazepam 1mg  q12 h prn, #20, RF x 1.   Therapeutic expectations and side effect profile of medication discussed today.  Patient's questions answered.

## 2012-06-04 NOTE — Assessment & Plan Note (Signed)
Gradually resolving. I rx'd prednisone 40mg  qd x 5d and he'll consider taking this if he feels like he is continuing to linger with these sx's while in [place]. I don't see any indication for CXR or blood work today.

## 2012-06-04 NOTE — Patient Instructions (Signed)
Stop your symbicort inhaler. Start mucinex DM.

## 2012-06-04 NOTE — Progress Notes (Signed)
OFFICE NOTE  06/04/2012  CC:  Chief Complaint  Patient presents with  . Cough    worse in shower, no better from 3/26 visit  . Anxiety    pt's father passed away last night, has to fly back to [place]; would like something to calm nerves/help sleep     HPI: Patient is a 42 y.o. [place] male who is here for ongoing cough.  Also, his father passed away in [place] last night and he has to go back to [place] today.  He has cough still, it is better but has been present for 1 mo.  No fever.  He has been taking the symbicort bid and thinks this actually makes him feel more "sick".  Denies SOB/wheezing. While in [place] recently he saw an MD who rx'd azith 500mg  x 3d and pt says this didn't help any. He is exhausted due to excessive airline travel, jetlag, stress due to grieving for his father's death. Appetite is ok, sleep is poor.   Pertinent PMH:  Past Medical History  Diagnosis Date  . Arthritis     Primarily knees, mild osteo  . Gout     2 attacks in right MTP joint around 2005; 2 more flares in foot/toe 2013  . Low testosterone   . Wrist pain     For 2-3 years in his 30s.  Extensive multidisciplinary w/u at [facility] and [facility] revealed NO ABNORMALITY.  Improved with ibuprofen and essentially has spontaneously resolved.  . 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 Overweight (BMI 25.0-29.9)     MEDS: Note: he is not taking ceftin listed below. Outpatient Prescriptions Prior to Visit  Medication Sig Dispense Refill  . albuterol (VENTOLIN HFA) 108 (90 BASE) MCG/ACT inhaler Inhale 2 puffs into the lungs every 4 (four) hours as needed for wheezing or shortness of breath. 1-2 puffs q6h prn cough/chest tightness/wheezing/shortness of breath  1 Inhaler  3  . allopurinol (ZYLOPRIM) 300 MG tablet Take 1 tablet (300 mg total) by mouth daily.  30 tablet  6  . benzonatate (TESSALON) 200 MG capsule Take 1 capsule (200 mg total) by mouth 3 (three) times daily as needed for cough.  20 capsule  0  . COLCRYS 0.6  MG tablet TAKE 1 TABLET BY MOUTH TWICE A DAY  60 tablet  1  . Fluticasone-Salmeterol (ADVAIR) 100-50 MCG/DOSE AEPB Inhale 1 puff into the lungs 2 (two) times daily.  1 each  4  . HYDROcodone-homatropine (HYCODAN) 5-1.5 MG/5ML syrup 1-2 tsp po qhs prn cough  240 mL  0  . cefUROXime (CEFTIN) 500 MG tablet Take 1 tablet (500 mg total) by mouth 2 (two) times daily.  20 tablet  0   No facility-administered medications prior to visit.    PE: Blood pressure 130/92, pulse 92, temperature 98.8 F (37.1 C), temperature source Temporal, height 5\' 8"  (1.727 m), weight 187 lb (84.823 kg). Gen: Alert, well appearing.  Patient is oriented to person, place, time, and situation. ENT: Ears: EACs clear, normal epithelium.  TMs with good light reflex and landmarks bilaterally.  Eyes: no injection, icteris, swelling, or exudate.  EOMI, PERRLA. Nose: no drainage or turbinate edema/swelling.  No injection or focal lesion.  Mouth: lips without lesion/swelling.  Oral mucosa pink and moist.  Dentition intact and without obvious caries or gingival swelling.  Oropharynx without erythema, exudate, or swelling.  Neck - No masses or thyromegaly or limitation in range of motion CV: RRR, no m/r/g.   LUNGS: CTA bilat, nonlabored  resps, good aeration in all lung fields. EXT: no clubbing, cyanosis, or edema.    IMPRESSION AND PLAN:  Asthmatic bronchitis Gradually resolving. I rx'd prednisone 40mg  qd x 5d and he'll consider taking this if he feels like he is continuing to linger with these sx's while in [place]. I don't see any indication for CXR or blood work today.  Bereavement reaction I gave rx for clonazepam 1mg  q12 h prn, #20, RF x 1.   Therapeutic expectations and side effect profile of medication discussed today.  Patient's questions answered.    An After Visit Summary was printed and given to the patient.  Spent 30 min with pt today, with >50% of this time spent in counseling and care coordination regarding the  above problems.  He flies back to [place] tonight for his father's funeral. FOLLOW UP: prn

## 2013-01-02 ENCOUNTER — Other Ambulatory Visit: Payer: Self-pay

## 2013-02-11 ENCOUNTER — Encounter: Payer: Self-pay | Admitting: Family Medicine

## 2013-02-11 ENCOUNTER — Ambulatory Visit (INDEPENDENT_AMBULATORY_CARE_PROVIDER_SITE_OTHER): Payer: 59 | Admitting: Family Medicine

## 2013-02-11 VITALS — BP 126/80 | HR 53 | Temp 98.0°F | Resp 18 | Ht 68.0 in | Wt 171.0 lb

## 2013-02-11 DIAGNOSIS — M109 Gout, unspecified: Secondary | ICD-10-CM

## 2013-02-11 DIAGNOSIS — Z0000 Encounter for general adult medical examination without abnormal findings: Secondary | ICD-10-CM

## 2013-02-11 DIAGNOSIS — Z23 Encounter for immunization: Secondary | ICD-10-CM

## 2013-02-11 LAB — CBC WITH DIFFERENTIAL/PLATELET
Basophils Relative: 0.3 % (ref 0.0–3.0)
Eosinophils Absolute: 0.2 10*3/uL (ref 0.0–0.7)
MCV: 85.4 fl (ref 78.0–100.0)
Neutro Abs: 2.5 10*3/uL (ref 1.4–7.7)
Neutrophils Relative %: 51.3 % (ref 43.0–77.0)
RDW: 13.9 % (ref 11.5–14.6)

## 2013-02-11 MED ORDER — COLCHICINE 0.6 MG PO TABS: ORAL_TABLET | ORAL | Status: DC

## 2013-02-11 MED ORDER — CLONAZEPAM 1 MG PO TABS: ORAL_TABLET | ORAL | Status: DC

## 2013-02-11 NOTE — Assessment & Plan Note (Addendum)
Reviewed age and gender appropriate health maintenance issues (prudent diet, regular exercise, health risks of tobacco and excessive alcohol, use of seatbelts, fire alarms in home, use of sunscreen).  Also reviewed age and gender appropriate health screening as well as vaccine recommendations. Flumist given today (he has not had any RAD in over 6 mo). HP labs + uric acid done and pending. Referral made to nutritionist.

## 2013-02-11 NOTE — Progress Notes (Addendum)
Pre visit review using our clinic review tool, if applicable. No additional management support is needed unless otherwise documented below in the visit note.       Office Note 02/11/2013  CC:  Chief Complaint  Patient presents with  . Annual Exam    HPI:  Johnny Atkins is a 42 y.o. [place]  male who is here for annual CPE. Fasting for labs today.   Gout affects him a few times a year.  He is taking daily allopurinol and takes colcrys for a few doses and his flares subside. Was on high protein diet from 07/2012 to 09/2012 and got wt down to low 160s.  Since getting back on his "regular" [place] diet (high carb), some of the weight has come back.  He exercises a lot: runs 20-40 miles per week.   Past Medical History  Diagnosis Date  . Arthritis     Primarily knees, mild osteo  . Gout     2 attacks in right MTP joint around 22-Jul-2003; 2 more flares in foot/toe 2011-07-22  . Low testosterone   . Wrist pain     For 2-3 years in his 30s.  Extensive multidisciplinary [facility]/u at [facility] and [facility] revealed NO ABNORMALITY.  Improved with ibuprofen and essentially has spontaneously resolved.  . 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 Overweight (BMI 25.0-29.9)     History reviewed. No pertinent past surgical history.  Family History  Problem Relation Age of Onset  . Arthritis Mother     osteo (no gout)  . Arthritis Father     osteo (no gout)    History   Social History  . Marital Status: Married    Spouse Name: N/A    Number of Children: N/A  . Years of Education: N/A   Occupational History  . Not on file.   Social History Main Topics  . Smoking status: Current Some Day Smoker    Types: Pipe, Cigars  . Smokeless tobacco: Never Used  . Alcohol Use: Yes  . Drug Use: No  . Sexual Activity: Not on file   Other Topics Concern  . Not on file   Social History Narrative   Married, 2 daughters (3 y/o and 14 y/o).   Occupation: Sport and exercise psychologist.  Got Master's degree in [place] and also one at  [employer].   Regular Exercise: 1-2 days per week (elliptical machine 30 min).  He is a vegetarian (tends to eat lots of lentils, bread, rice).     No cigarettes but occasional cigar/pipe.  Two alcohol drinks per week (beer or whisky).   Born in [place], moved to [place] in 07[place]22[place]1994.  He is the youngest of 5 siblings.  All siblings healthy but one died in 07-22-98 of a "stomach problem" rather suddenly.   ?BCG given in [place]?  Positive PPD here in Jul 22, 2003, CXR neg--no treatment.            MEDS: allopurinol 300mg  qd, colcrys 0.6mg  prn gout flare, Ventolin HFA 2 puffs q4h prn (has not used in > 6 mo).  No Known Allergies  ROS Review of Systems  Constitutional: Negative for fever, chills, appetite change and fatigue.  HENT: Negative for congestion, dental problem, ear pain and sore throat.   Eyes: Negative for discharge, redness and visual disturbance.  Respiratory: Negative for cough, chest tightness, shortness of breath and wheezing.   Cardiovascular: Negative for chest pain, palpitations and leg swelling.  Gastrointestinal: Negative for nausea, vomiting, abdominal pain, diarrhea and  blood in stool.  Genitourinary: Negative for dysuria, urgency, frequency, hematuria, flank pain and difficulty urinating.  Musculoskeletal: Negative for arthralgias, back pain, joint swelling, myalgias and neck stiffness.  Skin: Negative for pallor and rash.  Neurological: Negative for dizziness, speech difficulty, weakness and headaches.  Hematological: Negative for adenopathy. Does not bruise/bleed easily.  Psychiatric/Behavioral: Negative for confusion and sleep disturbance. The patient is not nervous/anxious.      PE; Blood pressure 126/80, pulse 53, temperature 98 F (36.7 C), temperature source Temporal, resp. rate 18, height 5\' 8"  (1.727 m), weight 171 lb (77.565 kg), SpO2 99.00%. Gen: Alert, well appearing.  Patient is oriented to person, place, time, and situation. AFFECT: pleasant, lucid thought and speech. ENT:  Ears: EACs clear, normal epithelium.  TMs with good light reflex and landmarks bilaterally.  Eyes: no injection, icteris, swelling, or exudate.  EOMI, PERRLA. Nose: no drainage or turbinate edema/swelling.  No injection or focal lesion.  Mouth: lips without lesion/swelling.  Oral mucosa pink and moist.  Dentition intact and without obvious caries or gingival swelling.  Oropharynx without erythema, exudate, or swelling.  Neck: supple/nontender.  No LAD, mass, or TM.  Carotid pulses 2+ bilaterally, without bruits. CV: RRR, no m/r/g.   LUNGS: CTA bilat, nonlabored resps, good aeration in all lung fields. ABD: soft, NT, ND, BS normal.  No hepatospenomegaly or mass.  No bruits. EXT: no clubbing, cyanosis, or edema.  Musculoskeletal: no joint swelling, erythema, warmth, or tenderness.  ROM of all joints intact. Skin - no sores or suspicious lesions or rashes or color changes Neuro: CN 2-12 intact bilaterally, strength 5/5 in proximal and distal upper extremities and lower extremities bilaterally.  No sensory deficits.  No tremor.  No disdiadochokinesis.  No ataxia.  Upper extremity and lower extremity DTRs symmetric.  No pronator drift.  Pertinent labs:  Health panel labs drawn today: pending  ASSESSMENT AND PLAN:   Health maintenance examination Reviewed age and gender appropriate health maintenance issues (prudent diet, regular exercise, health risks of tobacco and excessive alcohol, use of seatbelts, fire alarms in home, use of sunscreen).  Also reviewed age and gender appropriate health screening as well as vaccine recommendations. Flumist given today (he has not had any RAD in over 6 mo). HP labs + uric acid done and pending. Referral made to nutritionist.   An After Visit Summary was printed and given to the patient.  FOLLOW UP:  Return in about 1 year (around 02/11/2014) for CPE with fasting labs the week prior.   ADDENDUM: After pt left office I realized we gave him Flumist that had  expired on 02/03/13. I called the manufacturer ([employer]) and talked with their clinical specialist for this vaccine. He said this carried no risk.  He also said that their stability studies have shown that the vaccine is stable and effective for up to 4 wks after the expiration date.  He left decision to re-vaccinate up to my discretion but he said if I choose to do this I should wait 1 month minimum. I called pt and told him the above information and he was in agreement with NO REVACCINATION.---PM

## 2013-02-12 LAB — COMPREHENSIVE METABOLIC PANEL
ALT: 18 U/L (ref 0–53)
Albumin: 4.4 g/dL (ref 3.5–5.2)
Alkaline Phosphatase: 85 U/L (ref 39–117)
Creatinine, Ser: 0.9 mg/dL (ref 0.4–1.5)
Sodium: 140 mEq/L (ref 135–145)
Total Bilirubin: 1.2 mg/dL (ref 0.3–1.2)

## 2013-02-12 LAB — LIPID PANEL
Cholesterol: 147 mg/dL (ref 0–200)
HDL: 36.7 mg/dL — ABNORMAL LOW (ref >39.00)
Total CHOL/HDL Ratio: 4
VLDL: 24 mg/dL (ref 0.0–40.0)

## 2013-02-13 ENCOUNTER — Other Ambulatory Visit: Payer: Self-pay | Admitting: Family Medicine

## 2013-02-13 MED ORDER — ALLOPURINOL 300 MG PO TABS: 300.0000 mg | ORAL_TABLET | Freq: Two times a day (BID) | ORAL | Status: DC

## 2013-02-13 NOTE — Telephone Encounter (Signed)
Sent new rx for allopurinol per last lab result note.

## 2013-03-20 ENCOUNTER — Ambulatory Visit: Payer: 59 | Admitting: Family Medicine

## 2013-04-07 ENCOUNTER — Ambulatory Visit: Payer: 59 | Admitting: Family Medicine

## 2013-05-19 ENCOUNTER — Telehealth: Payer: Self-pay | Admitting: Family Medicine

## 2013-05-19 NOTE — Telephone Encounter (Signed)
Patient has scheduled OV

## 2013-05-19 NOTE — Telephone Encounter (Signed)
Patient LMOM wanting to know if you would remove skin tags for him.  Please advise.

## 2013-05-19 NOTE — Telephone Encounter (Signed)
Sure. Have him make a 30 min appt for procedure at his convenience.

## 2013-05-22 ENCOUNTER — Ambulatory Visit (INDEPENDENT_AMBULATORY_CARE_PROVIDER_SITE_OTHER): Payer: 59 | Admitting: Family Medicine

## 2013-05-22 ENCOUNTER — Encounter: Payer: Self-pay | Admitting: Family Medicine

## 2013-05-22 ENCOUNTER — Other Ambulatory Visit: Payer: Self-pay | Admitting: Family Medicine

## 2013-05-22 VITALS — BP 122/76 | HR 60 | Temp 98.1°F | Resp 18 | Ht 68.0 in | Wt 171.0 lb

## 2013-05-22 DIAGNOSIS — L918 Other hypertrophic disorders of the skin: Secondary | ICD-10-CM

## 2013-05-22 NOTE — Progress Notes (Signed)
Pre visit review using our clinic review tool, if applicable. No additional management support is needed unless otherwise documented below in the visit note. 

## 2013-05-22 NOTE — Progress Notes (Signed)
OFFICE NOTE  05/22/2013  CC:  Chief Complaint  Johnny Atkins presents with  . Procedure    possible cyst under left buttock     HPI: Johnny Atkins is a 43 y.o. [place] male who is here for a lesion on left side of bottom. Large pedunculated skin tag present for about 15 yrs, no pain. Recently sat on it for prolonged period on flights to and from [place] and it has been weeping clear fluid from surface since then.  No fevers or malaise.  Desires removal of lesion.   Pertinent PMH:  Past medical, surgical, social, and family history reviewed and no changes are noted since last office visit.  MEDS:  Outpatient Prescriptions Prior to Visit  Medication Sig Dispense Refill  . allopurinol (ZYLOPRIM) 300 MG tablet Take 1 tablet (300 mg total) by mouth 2 (two) times daily.  60 tablet  6  . colchicine (COLCRYS) 0.6 MG tablet 2 tabs at onset of gout flare, then 1 tab one hour later. Then 1 tab po bid starting on day #2 of flare and continue until no pain  60 tablet  1  . albuterol (VENTOLIN HFA) 108 (90 BASE) MCG/ACT inhaler Inhale 2 puffs into the lungs every 4 (four) hours as needed for wheezing or shortness of breath. 1-2 puffs q6h prn cough/chest tightness/wheezing/shortness of breath  1 Inhaler  3  . clonazePAM (KLONOPIN) 1 MG tablet 1 tab po q12 h prn anxiety/stress  20 tablet  0  . benzonatate (TESSALON) 200 MG capsule Take 1 capsule (200 mg total) by mouth 3 (three) times daily as needed for cough.  20 capsule  0  . HYDROcodone-homatropine (HYCODAN) 5-1.5 MG/5ML syrup 1-2 tsp po qhs prn cough  240 mL  0  . predniSONE (DELTASONE) 20 MG tablet 2 tabs po qd x 5d  10 tablet  0   No facility-administered medications prior to visit.    PE: Blood pressure 122/76, pulse 60, temperature 98.1 F (36.7 C), temperature source Temporal, resp. rate 18, height 5\' 8"  (1.727 m), weight 171 lb (77.565 kg), SpO2 97.00%. Back of left thigh with large pedunculated flesh colored mass with some scattered maceration of  the surface.  The lesion is nontender and has no surrounding induration or erythema.  Size of stalk is about 1 cm, size of lesion is about 3 cm diameter, 1 cm thick.  IMPRESSION AND PLAN: Large posterior left thigh pedunculated achrochordon.  Infiltrated base of lesion with 3cc of 1% lidocaine w/out epi for anesthesia. Removed lesion today by excision under sterile conditions. Pt tolerated procedure well, no immediate complications.  No suture needed.  Skin at base of lesion was left open to heal (very superficial defect) by secondary intention.  An After Visit Summary was printed and given to the Johnny Atkins.  FOLLOW UP: prn

## 2013-05-23 ENCOUNTER — Telehealth: Payer: Self-pay | Admitting: Family Medicine

## 2013-05-23 NOTE — Telephone Encounter (Signed)
Relevant patient education assigned to patient using Emmi. ° °

## 2014-03-23 ENCOUNTER — Encounter: Payer: Self-pay | Admitting: Family Medicine

## 2014-03-23 ENCOUNTER — Ambulatory Visit: Payer: Self-pay | Admitting: Family Medicine

## 2014-03-23 ENCOUNTER — Ambulatory Visit (INDEPENDENT_AMBULATORY_CARE_PROVIDER_SITE_OTHER): Payer: 59 | Admitting: Family Medicine

## 2014-03-23 VITALS — BP 124/83 | HR 81 | Temp 99.0°F | Ht 68.0 in | Wt 178.0 lb

## 2014-03-23 DIAGNOSIS — R059 Cough, unspecified: Secondary | ICD-10-CM

## 2014-03-23 DIAGNOSIS — J069 Acute upper respiratory infection, unspecified: Secondary | ICD-10-CM

## 2014-03-23 DIAGNOSIS — R05 Cough: Secondary | ICD-10-CM

## 2014-03-23 DIAGNOSIS — J029 Acute pharyngitis, unspecified: Secondary | ICD-10-CM

## 2014-03-23 MED ORDER — HYDROCODONE-HOMATROPINE 5-1.5 MG/5ML PO SYRP: ORAL_SOLUTION | ORAL | Status: DC

## 2014-03-23 NOTE — Progress Notes (Signed)
OFFICE NOTE  03/23/2014  CC: "sick"  HPI: Patient is a 44 y.o. [place] male who is here for 5 days runny nose, HA, facial pressure, cough, subjective fever "felt low grade".  When his cough is heavy he feels some chest tightness and wheeze briefly. Says the worst of his sx's is in nose/head/throat.  Throat getting more irritated from the PND.  Itchy eyes + d/c from eyes last couple of days. His 64 y/o son recently was dx'd with conjunctivitis and AOM. Tylenol cold + ibuprofen have been taken for current sx's.  He took a couple of doses of 500 mg amoxil the last couple days that his daughter had leftover.  Pertinent PMH:  Past medical, surgical, social, and family history reviewed and no changes are noted since last office visit.  MEDS:  Outpatient Prescriptions Prior to Visit  Medication Sig Dispense Refill  . allopurinol (ZYLOPRIM) 300 MG tablet Take 1 tablet (300 mg total) by mouth 2 (two) times daily. 60 tablet 6  . clonazePAM (KLONOPIN) 1 MG tablet 1 tab po q12 h prn anxiety/stress 20 tablet 0  . colchicine (COLCRYS) 0.6 MG tablet 2 tabs at onset of gout flare, then 1 tab one hour later. Then 1 tab po bid starting on day #2 of flare and continue until no pain 60 tablet 1  . albuterol (VENTOLIN HFA) 108 (90 BASE) MCG/ACT inhaler Inhale 2 puffs into the lungs every 4 (four) hours as needed for wheezing or shortness of breath. 1-2 puffs q6h prn cough/chest tightness/wheezing/shortness of breath (Patient not taking: Reported on 03/23/2014) 1 Inhaler 3   No facility-administered medications prior to visit.    PE: Blood pressure 124/83, pulse 81, temperature 99 F (37.2 C), temperature source Oral, height 5\' 8"  (1.727 m), weight 178 lb (80.74 kg), SpO2 99 %. VS: noted--normal. Gen: alert, NAD, NONTOXIC APPEARING. HEENT: eyes with minimal bulbar conjunctival injection.  No drainage or swelling.  Ears: EACs clear, TMs with normal light reflex and landmarks.  Nose: Clear rhinorrhea, with some  dried, crusty exudate adherent to mildly injected mucosa.  No purulent d/c.  No paranasal sinus TTP.  No facial swelling.  Throat and mouth without focal lesion.  No pharyngial swelling, erythema, or exudate.   Neck: supple, no LAD.   LUNGS: CTA bilat, nonlabored resps.  Even with forced exp maneuver he did not have wheeze or excessive post-exhalation coughing. CV: RRR, no m/r/g. EXT: no c/c/e SKIN: no rash  IMPRESSION AND PLAN:  Viral URI. No sign of RAD.  Pt had several questions about why this was not being treated with antibiotics and I answered to the best of my ability. In the end, pt agreeable to symptomatic care. Buy otc generic zaditor for your eyes. Continue OTC tylenol cold. Hycodan syrup, 1-2 tsp q6h prn, 157ml.  An After Visit Summary was printed and given to the patient.  FOLLOW UP: prn

## 2014-03-23 NOTE — Patient Instructions (Signed)
Buy otc generic zaditor for your eyes. Continue OTC tylenol cold.

## 2014-03-23 NOTE — Progress Notes (Signed)
Pre visit review using our clinic review tool, if applicable. No additional management support is needed unless otherwise documented below in the visit note. 

## 2014-04-03 ENCOUNTER — Ambulatory Visit: Payer: 59 | Admitting: Family Medicine

## 2014-04-08 ENCOUNTER — Other Ambulatory Visit (INDEPENDENT_AMBULATORY_CARE_PROVIDER_SITE_OTHER): Payer: 59

## 2014-04-08 DIAGNOSIS — Z0000 Encounter for general adult medical examination without abnormal findings: Secondary | ICD-10-CM

## 2014-04-08 LAB — COMPREHENSIVE METABOLIC PANEL
ALT: 17 U/L (ref 0–53)
AST: 12 U/L (ref 0–37)
Albumin: 4.2 g/dL (ref 3.5–5.2)
Alkaline Phosphatase: 98 U/L (ref 39–117)
BUN: 14 mg/dL (ref 6–23)
CO2: 29 meq/L (ref 19–32)
Calcium: 9.2 mg/dL (ref 8.4–10.5)
Chloride: 106 mEq/L (ref 96–112)
Creatinine, Ser: 0.95 mg/dL (ref 0.40–1.50)
GFR: 91.25 mL/min (ref >60.00)
GLUCOSE: 99 mg/dL (ref 70–99)
POTASSIUM: 5.1 meq/L (ref 3.5–5.1)
SODIUM: 140 meq/L (ref 135–145)
Total Bilirubin: 0.6 mg/dL (ref 0.2–1.2)
Total Protein: 6.6 g/dL (ref 6.0–8.3)

## 2014-04-08 LAB — TSH: TSH: 3.17 u[IU]/mL (ref 0.35–4.50)

## 2014-04-08 LAB — CBC WITH DIFFERENTIAL/PLATELET
Basophils Absolute: 0 10*3/uL (ref 0.0–0.1)
Basophils Relative: 0.6 % (ref 0.0–3.0)
EOS PCT: 3.1 % (ref 0.0–5.0)
Eosinophils Absolute: 0.2 10*3/uL (ref 0.0–0.7)
HCT: 41.9 % (ref 39.0–52.0)
HEMOGLOBIN: 14 g/dL (ref 13.0–17.0)
Lymphocytes Relative: 30.3 % (ref 12.0–46.0)
Lymphs Abs: 2 10*3/uL (ref 0.7–4.0)
MCHC: 33.3 g/dL (ref 30.0–36.0)
MCV: 83.4 fl (ref 78.0–100.0)
Monocytes Absolute: 0.5 10*3/uL (ref 0.1–1.0)
Monocytes Relative: 7.8 % (ref 3.0–12.0)
NEUTROS ABS: 3.9 10*3/uL (ref 1.4–7.7)
Neutrophils Relative %: 58.2 % (ref 43.0–77.0)
Platelets: 221 10*3/uL (ref 150.0–400.0)
RBC: 5.03 Mil/uL (ref 4.22–5.81)
RDW: 13.4 % (ref 11.5–15.5)
WBC: 6.7 10*3/uL (ref 4.0–10.5)

## 2014-04-08 LAB — LIPID PANEL
CHOLESTEROL: 148 mg/dL (ref 0–200)
HDL: 36.6 mg/dL — AB (ref >39.00)
LDL Cholesterol: 92 mg/dL (ref 0–99)
NONHDL: 111.4
Total CHOL/HDL Ratio: 4
Triglycerides: 97 mg/dL (ref 0.0–149.0)
VLDL: 19.4 mg/dL (ref 0.0–40.0)

## 2014-04-14 ENCOUNTER — Encounter: Payer: Self-pay | Admitting: Family Medicine

## 2014-04-14 ENCOUNTER — Ambulatory Visit (INDEPENDENT_AMBULATORY_CARE_PROVIDER_SITE_OTHER): Payer: 59 | Admitting: Family Medicine

## 2014-04-14 DIAGNOSIS — Z0000 Encounter for general adult medical examination without abnormal findings: Secondary | ICD-10-CM

## 2014-04-14 DIAGNOSIS — Z23 Encounter for immunization: Secondary | ICD-10-CM

## 2014-04-14 NOTE — Progress Notes (Signed)
Pre visit review using our clinic review tool, if applicable. No additional management support is needed unless otherwise documented below in the visit note. 

## 2014-04-14 NOTE — Progress Notes (Signed)
Office Note 04/14/2014  CC:  Chief Complaint  Patient presents with  . Annual Exam    HPI:  Johnny Atkins is a 44 y.o. [place]  male who is here for CPE. Reviewed recent fasting labs in detail today in office: all very good.  Reports twisted ankle with avusion fracture 12/2013, wore walking cast and feels fine now, has to go back for routine f/u that he missed. We'll get records.  He got over the URI/bronchitis I saw him for about 3 wks ago. His gout flares up mildly once every few months: he takes colchicine x 2-3 doses and symptoms resolve.  No allopurinol b/c he has a tough time taking med every day.    Past Medical History  Diagnosis Date  . Arthritis     Primarily knees, mild osteo  . Gout     2 attacks in right MTP joint around 25-Jun-2003; 2 more flares in foot/toe 2011/06/25  . Low testosterone   . Wrist pain     For 2-3 years in his 43s.  Extensive multidisciplinary w/u at [facility] and [facility] revealed NO ABNORMALITY.  Improved with ibuprofen and essentially has spontaneously resolved.  . 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 Overweight (BMI 25.0-29.9)   . IFG (impaired fasting glucose)     Fasting gluc 101-107    History reviewed. No pertinent past surgical history.  Family History  Problem Relation Age of Onset  . Arthritis Mother     osteo (no gout)  . Arthritis Father     osteo (no gout)    History   Social History  . Marital Status: Married    Spouse Name: N/A  . Number of Children: N/A  . Years of Education: N/A   Occupational History  . Not on file.   Social History Main Topics  . Smoking status: Current Some Day Smoker    Types: Pipe, Cigars  . Smokeless tobacco: Never Used  . Alcohol Use: Yes  . Drug Use: No  . Sexual Activity: Not on file   Other Topics Concern  . Not on file   Social History Narrative   Married, 2 daughters (18 y/o and 64 y/o).   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   Regular Exercise: 1-2 days per week (elliptical machine 30 min).  He is a vegetarian (tends to eat lots of lentils, bread, rice).     No cigarettes but occasional cigar/pipe.  Two alcohol drinks per week (beer or whisky).   Born in [place], moved to [place] in 06-25[birth year].  He is the youngest of 5 siblings.  All siblings healthy but one died in 06/25/1998 of a "stomach problem" rather suddenly.   ?BCG given in [place]?  Positive PPD here in 06/25/2003, CXR neg--no treatment.             MEDS: colchicine, albuterol prn, clonazepam  No Known Allergies  ROS Review of Systems  Constitutional: Negative for fever, chills, appetite change and fatigue.  HENT: Negative for congestion, dental problem, ear pain and sore throat.   Eyes: Negative for discharge, redness and visual disturbance.  Respiratory: Negative for cough, chest tightness, shortness of breath and wheezing.   Cardiovascular: Negative for chest pain, palpitations and leg swelling.  Gastrointestinal: Negative for nausea, vomiting, abdominal pain, diarrhea and blood in stool.  Genitourinary: Negative for dysuria, urgency, frequency, hematuria, flank pain and difficulty urinating.  Musculoskeletal: Negative for myalgias, back pain, joint swelling,  arthralgias and neck stiffness.  Skin: Negative for pallor and rash.  Neurological: Negative for dizziness, speech difficulty, weakness and headaches.  Hematological: Negative for adenopathy. Does not bruise/bleed easily.  Psychiatric/Behavioral: Negative for confusion and sleep disturbance. The patient is not nervous/anxious.     PE; Blood pressure 131/81, pulse 77, temperature 98.6 F (37 C), temperature source Temporal, resp. rate 16, height 5\' 8"  (1.727 m), weight 174 lb (78.926 kg), SpO2 97 %. Gen: Alert, well appearing.  Patient is oriented to person, place, time, and situation. AFFECT: pleasant, lucid thought and speech. ENT: Ears: EACs clear, normal epithelium.  TMs with good light reflex and landmarks  bilaterally.  Eyes: no injection, icteris, swelling, or exudate.  EOMI, PERRLA. Nose: no drainage or turbinate edema/swelling.  No injection or focal lesion.  Mouth: lips without lesion/swelling.  Oral mucosa pink and moist.  Dentition intact and without obvious caries or gingival swelling.  Oropharynx without erythema, exudate, or swelling.  Neck: supple/nontender.  No LAD, mass, or TM.  Carotid pulses 2+ bilaterally, without bruits. CV: RRR, no m/r/g.   LUNGS: CTA bilat, nonlabored resps, good aeration in all lung fields. ABD: soft, NT, ND, BS normal.  No hepatospenomegaly or mass.  No bruits. EXT: no clubbing, cyanosis, or edema.  Musculoskeletal: no joint swelling, erythema, warmth, or tenderness.  ROM of all joints intact. Skin - no sores or suspicious lesions or rashes or color changes   Pertinent labs:  Lab Results  Component Value Date   TSH 3.17 04/08/2014   Lab Results  Component Value Date   WBC 6.7 04/08/2014   HGB 14.0 04/08/2014   HCT 41.9 04/08/2014   MCV 83.4 04/08/2014   PLT 221.0 04/08/2014   Lab Results  Component Value Date   CREATININE 0.95 04/08/2014   BUN 14 04/08/2014   NA 140 04/08/2014   K 5.1 04/08/2014   CL 106 04/08/2014   CO2 29 04/08/2014   Lab Results  Component Value Date   ALT 17 04/08/2014   AST 12 04/08/2014   ALKPHOS 98 04/08/2014   BILITOT 0.6 04/08/2014   Lab Results  Component Value Date   CHOL 148 04/08/2014   Lab Results  Component Value Date   HDL 36.60* 04/08/2014   Lab Results  Component Value Date   LDLCALC 92 04/08/2014   Lab Results  Component Value Date   TRIG 97.0 04/08/2014   Lab Results  Component Value Date   CHOLHDL 4 04/08/2014    ASSESSMENT AND PLAN:   Health maintenance exam: Reviewed age and gender appropriate health maintenance issues (prudent diet, regular exercise, health risks of tobacco and excessive alcohol, use of seatbelts, fire alarms in home, use of sunscreen).  Also reviewed age and  gender appropriate health screening as well as vaccine recommendations. Flu vaccine IM today. All health maintenance labs normal/reviewed with pt. Gout management: we'll leave it alone and he'll continue how he's doing things for now but if flares become more frequent then I advised him to restart allopurinol at one tab per day.  An After Visit Summary was printed and given to the patient.   FOLLOW UP:  Return in about 1 year (around 04/15/2015) for annual CPE with fasting labs the week prior.

## 2014-04-15 ENCOUNTER — Telehealth: Payer: Self-pay | Admitting: Family Medicine

## 2014-04-15 NOTE — Telephone Encounter (Signed)
emmi emailed °

## 2014-06-18 ENCOUNTER — Encounter (HOSPITAL_COMMUNITY): Payer: Self-pay | Admitting: Emergency Medicine

## 2014-06-18 ENCOUNTER — Emergency Department (HOSPITAL_COMMUNITY)
Admission: EM | Admit: 2014-06-18 | Discharge: 2014-06-18 | Disposition: A | Discharge status: Home / Self Care | Payer: No Typology Code available for payment source | Attending: Emergency Medicine | Admitting: Emergency Medicine

## 2014-06-18 ENCOUNTER — Emergency Department (HOSPITAL_COMMUNITY): Payer: No Typology Code available for payment source

## 2014-06-18 DIAGNOSIS — S4992XA Unspecified injury of left shoulder and upper arm, initial encounter: Secondary | ICD-10-CM | POA: Diagnosis not present

## 2014-06-18 DIAGNOSIS — S4991XA Unspecified injury of right shoulder and upper arm, initial encounter: Secondary | ICD-10-CM | POA: Insufficient documentation

## 2014-06-18 DIAGNOSIS — Z720 Tobacco use: Secondary | ICD-10-CM | POA: Diagnosis not present

## 2014-06-18 DIAGNOSIS — Z87438 Personal history of other diseases of male genital organs: Secondary | ICD-10-CM | POA: Diagnosis not present

## 2014-06-18 DIAGNOSIS — Z79899 Other long term (current) drug therapy: Secondary | ICD-10-CM | POA: Diagnosis not present

## 2014-06-18 DIAGNOSIS — Y9389 Activity, other specified: Secondary | ICD-10-CM | POA: Insufficient documentation

## 2014-06-18 DIAGNOSIS — M109 Gout, unspecified: Secondary | ICD-10-CM | POA: Insufficient documentation

## 2014-06-18 DIAGNOSIS — S199XXA Unspecified injury of neck, initial encounter: Secondary | ICD-10-CM | POA: Insufficient documentation

## 2014-06-18 DIAGNOSIS — E663 Overweight: Secondary | ICD-10-CM | POA: Insufficient documentation

## 2014-06-18 DIAGNOSIS — Y998 Other external cause status: Secondary | ICD-10-CM | POA: Diagnosis not present

## 2014-06-18 DIAGNOSIS — S134XXA Sprain of ligaments of cervical spine, initial encounter: Secondary | ICD-10-CM

## 2014-06-18 DIAGNOSIS — Y92410 Unspecified street and highway as the place of occurrence of the external cause: Secondary | ICD-10-CM | POA: Diagnosis not present

## 2014-06-18 MED ORDER — CYCLOBENZAPRINE HCL 5 MG PO TABS: 5.0000 mg | ORAL_TABLET | Freq: Three times a day (TID) | ORAL | Status: DC | PRN

## 2014-06-18 MED ORDER — NAPROXEN 500 MG PO TABS: 500.0000 mg | ORAL_TABLET | Freq: Two times a day (BID) | ORAL | Status: DC

## 2014-06-18 MED ORDER — LIDOCAINE 5 % EX PTCH: 1.0000 | MEDICATED_PATCH | CUTANEOUS | Status: DC
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
## 2014-12-22 ENCOUNTER — Encounter: Payer: Self-pay | Admitting: Family Medicine

## 2014-12-22 ENCOUNTER — Ambulatory Visit (INDEPENDENT_AMBULATORY_CARE_PROVIDER_SITE_OTHER): Payer: 59 | Admitting: Family Medicine

## 2014-12-22 VITALS — BP 127/80 | HR 52 | Temp 97.8°F | Resp 16 | Ht 68.0 in | Wt 172.0 lb

## 2014-12-22 DIAGNOSIS — B356 Tinea cruris: Secondary | ICD-10-CM

## 2014-12-22 DIAGNOSIS — Z23 Encounter for immunization: Secondary | ICD-10-CM

## 2014-12-22 DIAGNOSIS — B354 Tinea corporis: Secondary | ICD-10-CM

## 2014-12-22 NOTE — Patient Instructions (Signed)
Apply OTC generic lamisil cream to the affected areas in groin, L foot, and R ankle TWICE PER DAY. Apply like this until you have seen no more rash for 3 days.

## 2014-12-22 NOTE — Progress Notes (Signed)
OFFICE NOTE  12/22/2014  CC:  Chief Complaint  Patient presents with  . Rash    x 1 month  . Pruritis    x 2-3 weeks     HPI: Patient is a 44 y.o. [place] male who is here for itchy rash in groin x 3-4 weeks.  Also, small round area L foot on top, R lateral ankle area as well.  Areas on foot/ankle do not itch or hurt. He has applied occasional neosporin to groin area and lesions on L foot and R ankle---felt more itching, if anything.    No otc antifungals have been tried. He is a runner 5-10 miles several days a week, occ friction rash in groin crease.  Pertinent PMH:  Past medical, surgical, social, and family history reviewed and no changes are noted since last office visit.  MEDS:  Outpatient Prescriptions Prior to Visit  Medication Sig Dispense Refill  . albuterol (VENTOLIN HFA) 108 (90 BASE) MCG/ACT inhaler Inhale 2 puffs into the lungs every 4 (four) hours as needed for wheezing or shortness of breath. 1-2 puffs q6h prn cough/chest tightness/wheezing/shortness of breath 1 Inhaler 3  . colchicine (COLCRYS) 0.6 MG tablet 2 tabs at onset of gout flare, then 1 tab one hour later. Then 1 tab po bid starting on day #2 of flare and continue until no pain 60 tablet 1  . allopurinol (ZYLOPRIM) 300 MG tablet Take 1 tablet (300 mg total) by mouth 2 (two) times daily. 60 tablet 6  . clonazePAM (KLONOPIN) 1 MG tablet 1 tab po q12 h prn anxiety/stress (Patient not taking: Reported on 12/22/2014) 20 tablet 0  . cyclobenzaprine (FLEXERIL) 5 MG tablet Take 1 tablet (5 mg total) by mouth 3 (three) times daily as needed for muscle spasms. (Patient not taking: Reported on 12/22/2014) 30 tablet 0  . lidocaine (LIDODERM) 5 % Place 1 patch onto the skin daily. Remove & Discard patch within 12 hours or as directed by MD (Patient not taking: Reported on 12/22/2014) 30 patch 0  . naproxen (NAPROSYN) 500 MG tablet Take 1 tablet (500 mg total) by mouth 2 (two) times daily with a meal. (Patient not taking:  Reported on 12/22/2014) 20 tablet 0   No facility-administered medications prior to visit.    PE: Blood pressure 127/80, pulse 52, temperature 97.8 F (36.6 C), temperature source Oral, resp. rate 16, height 5\' 8"  (1.727 m), weight 172 lb (78.019 kg), SpO2 100 %. Gen: Alert, well appearing.  Patient is oriented to person, place, time, and situation. GU: hyperpigmented macular rash with slight superficial flakiness texture, well demarcated borders---mainly in groin/thigh creases. Dorsal aspect L foot with quarter sized oval of hyperpigmented macular rash with slight flakiness, ? Suggestion of central clearing.  R ankle with hyperpigmented macular oval over lateral malleolus region.  No vesicles, pustules, papules, or ulcerations.  IMPRESSION AND PLAN:  Tinea corporis (L foot and R ankle) and tinea cruris: instructions--Apply OTC generic lamisil cream to the affected areas in groin, L foot, and R ankle TWICE PER DAY. Apply like this until you have seen no more rash for 3 days.  An After Visit Summary was printed and given to the patient.  FOLLOW UP: prn

## 2014-12-22 NOTE — Progress Notes (Signed)
Pre visit review using our clinic review tool, if applicable. No additional management support is needed unless otherwise documented below in the visit note. 

## 2014-12-24 ENCOUNTER — Encounter: Payer: 59 | Admitting: Family Medicine

## 2015-02-19 ENCOUNTER — Encounter: Payer: Self-pay | Admitting: Family Medicine

## 2015-02-19 ENCOUNTER — Ambulatory Visit (INDEPENDENT_AMBULATORY_CARE_PROVIDER_SITE_OTHER): Payer: 59 | Admitting: Family Medicine

## 2015-02-19 VITALS — BP 123/80 | HR 94 | Temp 98.7°F | Resp 20 | Wt 180.0 lb

## 2015-02-19 DIAGNOSIS — R69 Illness, unspecified: Principal | ICD-10-CM

## 2015-02-19 DIAGNOSIS — J111 Influenza due to unidentified influenza virus with other respiratory manifestations: Secondary | ICD-10-CM | POA: Diagnosis not present

## 2015-02-19 MED ORDER — HYDROCODONE-HOMATROPINE 5-1.5 MG/5ML PO SYRP: ORAL_SOLUTION | ORAL | Status: DC

## 2015-02-19 NOTE — Progress Notes (Signed)
OFFICE VISIT  02/19/2015   CC:  Chief Complaint  Patient presents with  . URI    green mucous   HPI:    Patient is a 44 y.o. [place] male who presents for respiratory complaints.   Onset 2 days ago, runny nose and mild cough, then ST and hoarseness and cough getting much worse--productive of some greenish/grey phlegm, + HA, eyes/nose itchy and burning. No wheezing or SOB.  No n/v/d.  Taking ibup 400 mg twice a day.  Vicks vapor rub.  Mild subjective fevers per pt's description, mild generalized body aches. Two family members with resp illness in the last couple weeks. He did get flu vaccine this season (11/2014).  Past Medical History  Diagnosis Date  . Arthritis     Primarily knees, mild osteo  . Gout     2 attacks in right MTP joint around 2005; 2 more flares in foot/toe 2013  . Low testosterone   . Wrist pain     For 2-3 years in his 75s.  Extensive multidisciplinary w/u at [facility] and [facility] revealed NO ABNORMALITY.  Improved with ibuprofen and essentially has spontaneously resolved.  . 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 Overweight (BMI 25.0-29.9)   . IFG (impaired fasting glucose)     Fasting gluc 101-107   No past surgical history on file.  Outpatient Prescriptions Prior to Visit  Medication Sig Dispense Refill  . albuterol (VENTOLIN HFA) 108 (90 BASE) MCG/ACT inhaler Inhale 2 puffs into the lungs every 4 (four) hours as needed for wheezing or shortness of breath. 1-2 puffs q6h prn cough/chest tightness/wheezing/shortness of breath 1 Inhaler 3  . colchicine (COLCRYS) 0.6 MG tablet 2 tabs at onset of gout flare, then 1 tab one hour later. Then 1 tab po bid starting on day #2 of flare and continue until no pain 60 tablet 1  . allopurinol (ZYLOPRIM) 300 MG tablet Take 1 tablet (300 mg total) by mouth 2 (two) times daily. 60 tablet 6   No facility-administered medications prior to visit.    No Known Allergies  ROS As per HPI  PE: Blood pressure 123/80, pulse 94,  temperature 98.7 F (37.1 C), resp. rate 20, weight 180 lb (81.647 kg), SpO2 95 %. VS: noted--normal. Gen: alert, NAD, NONTOXIC APPEARING. HEENT: eyes without injection, drainage, or swelling.  Ears: EACs clear, TMs with normal light reflex and landmarks.  Nose: Clear rhinorrhea, with some dried, crusty exudate adherent to mildly injected mucosa.  No purulent d/c.  No paranasal sinus TTP.  No facial swelling.  Throat and mouth without focal lesion.  No pharyngial swelling, erythema, or exudate.   Neck: supple, no LAD.   LUNGS: CTA bilat, nonlabored resps.   CV: RRR, no m/r/g. EXT: no c/c/e SKIN: no rash  LABS:  Rapid flu A/B test today: NEGATIVE  IMPRESSION AND PLAN:  Influenza-like illness, rapid flu testing neg. Symptomatic care discussed. Saline nasal spray, hycodan syrup 1-2 tsp qhs cough, delsym otc for daytime cough. Watch for wheezing/chest tightness, use albuterol inhaler q4h prn if these sx's should arise. Signs/symptoms to call or return for were reviewed and pt expressed understanding.  An After Visit Summary was printed and given to the patient.  FOLLOW UP: Return if symptoms worsen or fail to improve.

## 2015-02-19 NOTE — Patient Instructions (Signed)
For cough during the day, buy OTC generic delsym.

## 2015-07-22 ENCOUNTER — Telehealth: Payer: Self-pay | Admitting: *Deleted

## 2015-07-22 NOTE — Telephone Encounter (Signed)
Pt LMOM on 07/22/15 at 12:07pm stating that he has been having some right shoulder pain x 2-3 months with no known injury. He stated that the pain started out mild but he recently traveled to [place] and will there he was doing some heaving lifting and this has caused his pain to increase. Pt wanted to see about a referral but since this is a new problem and has not been evaluated by Dr. Anitra Lauth I recommended pt come in for ov to be evaluated by PCP first. Pt agreed and voiced understanding. He stated that he would call back to schedule apt.

## 2015-07-28 ENCOUNTER — Encounter: Payer: Self-pay | Admitting: Family Medicine

## 2015-07-28 ENCOUNTER — Ambulatory Visit (HOSPITAL_BASED_OUTPATIENT_CLINIC_OR_DEPARTMENT_OTHER)
Admission: RE | Admit: 2015-07-28 | Discharge: 2015-07-28 | Disposition: A | Discharge status: Home / Self Care | Payer: 59 | Source: Ambulatory Visit | Attending: Family Medicine | Admitting: Family Medicine

## 2015-07-28 ENCOUNTER — Ambulatory Visit (INDEPENDENT_AMBULATORY_CARE_PROVIDER_SITE_OTHER): Payer: 59 | Admitting: Family Medicine

## 2015-07-28 VITALS — BP 111/71 | HR 56 | Temp 98.0°F | Resp 16 | Ht 68.0 in | Wt 171.5 lb

## 2015-07-28 DIAGNOSIS — M25511 Pain in right shoulder: Secondary | ICD-10-CM

## 2015-07-28 DIAGNOSIS — R195 Other fecal abnormalities: Secondary | ICD-10-CM | POA: Diagnosis not present

## 2015-07-28 DIAGNOSIS — L509 Urticaria, unspecified: Secondary | ICD-10-CM | POA: Diagnosis not present

## 2015-07-28 NOTE — Progress Notes (Signed)
Pre visit review using our clinic review tool, if applicable. No additional management support is needed unless otherwise documented below in the visit note. 

## 2015-07-28 NOTE — Progress Notes (Signed)
OFFICE VISIT  07/28/2015   CC:  Chief Complaint  Patient presents with  . Shoulder Pain    right x 1 month     HPI:    Patient is a 45 y.o. [place] male who presents for right shoulder pain. Onset about 1 mo ago.  Pain in anterolateral region, hurts with shoulder in aBducted position, also with external rotation.  No strain or trauma preceding the pain.  He is unable to lay on the shoulder to go to sleep. No neck pain.  No R arm paresthesias.  Has never had this before.   He runs for exercise and he says the arm motion during running bothers it only a little.  He also complains of a couple of cases of hives, each case coming after he returned from spending time in the country of [place].  These resolved with zyrtec.  Finally, he says that 6 days ago he had a BM and he noted a small worm about the length of his pinky finger in the stool water after he had the BM.  No blood.  He has had no abdominal pain, nausea, vomiting, fever, or loss of appetite.  He has not seen any additional worms since that time. He states he usually is careful to drink noncontaminated water while in [place] but he did have to get in the [place] briefly.  Past Medical History  Diagnosis Date  . Arthritis     Primarily knees, mild osteo  . Gout     2 attacks in right MTP joint around 2005; 2 more flares in foot/toe 2013  . Low testosterone   . Wrist pain     For 2-3 years in his 65s.  Extensive multidisciplinary [facility]/u at [facility] and [facility] revealed NO ABNORMALITY.  Improved with ibuprofen and essentially has spontaneously resolved.  . 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 Overweight (BMI 25.0-29.9)   . IFG (impaired fasting glucose)     Fasting gluc 101-107    No past surgical history on file.  Outpatient Prescriptions Prior to Visit  Medication Sig Dispense Refill  . albuterol (VENTOLIN HFA) 108 (90 BASE) MCG/ACT inhaler Inhale 2 puffs into the lungs every 4 (four) hours as needed for wheezing or  shortness of breath. 1-2 puffs q6h prn cough/chest tightness/wheezing/shortness of breath 1 Inhaler 3  . colchicine (COLCRYS) 0.6 MG tablet 2 tabs at onset of gout flare, then 1 tab one hour later. Then 1 tab po bid starting on day #2 of flare and continue until no pain 60 tablet 1  . allopurinol (ZYLOPRIM) 300 MG tablet Take 1 tablet (300 mg total) by mouth 2 (two) times daily. 60 tablet 6  . HYDROcodone-homatropine (HYCODAN) 5-1.5 MG/5ML syrup 1-2 tsp po qhs prn cough (Patient not taking: Reported on 07/28/2015) 120 mL 0   No facility-administered medications prior to visit.    No Known Allergies  ROS As per HPI  PE: Blood pressure 111/71, pulse 56, temperature 98 F (36.7 C), temperature source Oral, resp. rate 16, height 5\' 8"  (1.727 m), weight 171 lb 8 oz (77.792 kg), SpO2 96 %. Gen: Alert, well appearing.  Patient is oriented to person, place, time, and situation. Neck nontender, full ROM. He has a mild tender spot just superior to his R clavicle.  He is also tender directly over his acromion where it meets the clavicle.  There is notable asymmetry to his AC joints, with the right being much more prominent.  No clavicle tenderness.  No tenderness of biceps tendon. Speed's and yergason's negative. ER/IR w/out pain on either side. Mild pain with aBduction of R arm but not in the typical RC tendon arc. Spurling's negative. No arm weakness or sensory abnormalities.  LABS:  none  IMPRESSION AND PLAN:  1) Right shoulder pain, unclear etiology. Pt states that he thinks his AC joint asymmetry is old/chronic, but given the physical exam findings today I want to x-ray the right shoulder to further evaluate. He may need referral to sports med or ortho.  2) Question of helminth infection: he describes seeing a worm "like an earthworm". He has seen only one and is asymptomatic.  I will check stool o&p and if neg will simply reassure him.  3) Urticaria; resolved.  He prefers to stay off  of zyrtec at this time but will use it again if hives return.  An After Visit Summary was printed and given to the patient.  Spent 25 min with pt today, with >50% of this time spent in counseling and care coordination regarding the above problems.  FOLLOW UP: Return for f/u to be determined based on work up.  Signed:  Crissie Sickles, MD           07/28/2015

## 2015-07-29 ENCOUNTER — Telehealth: Payer: Self-pay | Admitting: Family Medicine

## 2015-07-29 DIAGNOSIS — M25511 Pain in right shoulder: Secondary | ICD-10-CM

## 2015-07-29 HISTORY — DX: Pain in right shoulder: M25.511

## 2015-07-29 NOTE — Telephone Encounter (Signed)
I recommend he take over the counter zyrtec 10mg  every day for itching and prevention of the hives that he described.

## 2015-07-29 NOTE — Telephone Encounter (Signed)
Patient aware.

## 2015-07-29 NOTE — Addendum Note (Signed)
Addended by: Ralph Dowdy on: 07/29/2015 07:57 AM   Modules accepted: Orders

## 2015-07-29 NOTE — Telephone Encounter (Signed)
I contacted pt to tell him results of his x-rays, when I contacted him he wanted to know what you were going to do for his itching skin?  I told patient that I would ask.  I also told patient that he came into office for an acute shoulder pain visit and he brought up quite a few other complaints and that I wasn't sure that you had time to address.  Please advise.  He is requesting a call back.

## 2015-07-30 LAB — OVA AND PARASITE EXAMINATION: OP: NONE SEEN

## 2015-08-02 ENCOUNTER — Other Ambulatory Visit: Payer: Self-pay | Admitting: Family Medicine

## 2015-08-02 DIAGNOSIS — M25511 Pain in right shoulder: Secondary | ICD-10-CM

## 2015-08-05 ENCOUNTER — Ambulatory Visit (INDEPENDENT_AMBULATORY_CARE_PROVIDER_SITE_OTHER): Payer: 59 | Admitting: Family Medicine

## 2015-08-05 ENCOUNTER — Encounter: Payer: Self-pay | Admitting: Family Medicine

## 2015-08-05 VITALS — BP 122/80 | HR 61 | Ht 69.0 in | Wt 170.0 lb

## 2015-08-05 DIAGNOSIS — M25511 Pain in right shoulder: Secondary | ICD-10-CM | POA: Diagnosis not present

## 2015-08-05 MED ORDER — METHYLPREDNISOLONE ACETATE 40 MG/ML IJ SUSP
40.0000 mg | Freq: Once | INTRAMUSCULAR | Status: AC
  Administered 2015-08-05: 40 mg via INTRA_ARTICULAR

## 2015-08-05 NOTE — Patient Instructions (Signed)
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 You were given a cortisone shot today. Ice the area 15 minutes at a time 3-4 times a day. Aleve 2 tabs twice a day with food OR ibuprofen 600mg  three times a day with food as needed for pain and inflammation. I would avoid pushups, reaching, overhead motions as much as possible for the next week. Repeating an injection is an option in the future but most of the time this is not necessary. Follow up with me in 1 month or as needed.

## 2015-08-09 DIAGNOSIS — M25511 Pain in right shoulder: Secondary | ICD-10-CM | POA: Insufficient documentation

## 2015-08-09 NOTE — Progress Notes (Signed)
PCP and consultation requested by: Tammi Sou, MD  Subjective:   HPI: Patient is a 45 y.o. male here for right shoulder pain.  Patient reports he's had about 1 month of superior right shoulder pain. No acute injury or trauma. Pain worse with overhead motions and reaching. Unable to lie down on right side. Pain is 3/10, sharp with these motions. No prior issues with right shoulder. No skin changes, numbness.  Past Medical History  Diagnosis Date  . Arthritis     Primarily knees, mild osteo  . Gout     2 attacks in right MTP joint around 06-04-2003; 2 more flares in foot/toe Jun 04, 2011  . Low testosterone   . Wrist pain     For 2-3 years in his 72s.  Extensive multidisciplinary w/u at [facility] and [facility] revealed NO ABNORMALITY.  Improved with ibuprofen and essentially has spontaneously resolved.  . 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 Overweight (BMI 25.0-29.9)   . IFG (impaired fasting glucose)     Fasting gluc 101-107    Current Outpatient Prescriptions on File Prior to Visit  Medication Sig Dispense Refill  . albuterol (VENTOLIN HFA) 108 (90 BASE) MCG/ACT inhaler Inhale 2 puffs into the lungs every 4 (four) hours as needed for wheezing or shortness of breath. 1-2 puffs q6h prn cough/chest tightness/wheezing/shortness of breath 1 Inhaler 3  . allopurinol (ZYLOPRIM) 300 MG tablet Take 1 tablet (300 mg total) by mouth 2 (two) times daily. 60 tablet 6  . colchicine (COLCRYS) 0.6 MG tablet 2 tabs at onset of gout flare, then 1 tab one hour later. Then 1 tab po bid starting on day #2 of flare and continue until no pain 60 tablet 1   No current facility-administered medications on file prior to visit.    No past surgical history on file.  No Known Allergies  Social History   Social History  . Marital Status: Married    Spouse Name: N/A  . Number of Children: N/A  . Years of Education: N/A   Occupational History  . Not on file.   Social History Main Topics  . Smoking  status: Current Some Day Smoker    Types: Pipe, Cigars  . Smokeless tobacco: Never Used  . Alcohol Use: 0.0 oz/week    0 Standard drinks or equivalent per week  . Drug Use: No  . Sexual Activity: Not on file   Other Topics Concern  . Not on file   Social History Narrative   Married, 2 daughters (68 y/o and 26 y/o).   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   Regular Exercise: 1-2 days per week (elliptical machine 30 min).  He is a vegetarian (tends to eat lots of lentils, bread, rice).     No cigarettes but occasional cigar/pipe.  Two alcohol drinks per week (beer or whisky).   Born in [place], moved to [place] in 06/04[place]1994.  He is the youngest of 5 siblings.  All siblings healthy but one died in June 04, 1998 of a "stomach problem" rather suddenly.   ?BCG given in [place]?  Positive PPD here in 06/04/03, CXR neg--no treatment.             Family History  Problem Relation Age of Onset  . Arthritis Mother     osteo (no gout)  . Arthritis Father     osteo (no gout)    BP 122/80 mmHg  Pulse 61  Ht 5\' 9"  (1.753  m)  Wt 170 lb (77.111 kg)  BMI 25.09 kg/m2  Review of Systems: See HPI above.    Objective:  Physical Exam:  Gen: NAD, comfortable in exam room  Right shoulder: No swelling, ecchymoses.  No gross deformity. TTP AC joint.  No biceps tendon, other tenderness. FROM with painful arc. Negative Hawkins (pain superior shoulder), negative Neers. Negative Yergasons. Positive crossover adduction. Strength 5/5 with empty can and resisted internal/external rotation. Negative apprehension. NV intact distally.  Left shoulder: FROM without pain.  MSK u/s Right shoulder:  Biceps tendon intact without abnormalities on long and trans views.  Subscapularis intact without impingement.  Infraspinatus intact on long and trans views.  Supraspinatus intact on long and trans views without overlying bursitis or impingement.  AC joint with effusion compared to  left AC.  Images saved for documentation.    Assessment & Plan:  1. Right AC synovitis - Discussed icing, nsaids.  Avoid pushups, reaching, overhead motions, across body motions as much as possible.  He opted for injection which was given today.  F/u in 1 month or as needed.  After informed written consent patient was seated in chair in exam room.  Right AC identified with ultrasound, area prepped with alcohol swab then right AC injected with 0.5:0.58mL marcaine:depomedrol.  Patient tolerated procedure well without immediate complications.

## 2015-08-09 NOTE — Assessment & Plan Note (Signed)
Right AC synovitis - Discussed icing, nsaids.  Avoid pushups, reaching, overhead motions, across body motions as much as possible.  He opted for injection which was given today.  F/u in 1 month or as needed.  After informed written consent patient was seated in chair in exam room.  Right AC identified with ultrasound, area prepped with alcohol swab then right AC injected with 0.5:0.10mL marcaine:depomedrol.  Patient tolerated procedure well without immediate complications.

## 2015-08-12 ENCOUNTER — Encounter: Payer: Self-pay | Admitting: Family Medicine

## 2016-01-04 ENCOUNTER — Ambulatory Visit (INDEPENDENT_AMBULATORY_CARE_PROVIDER_SITE_OTHER): Payer: 59 | Admitting: Family Medicine

## 2016-01-04 ENCOUNTER — Encounter: Payer: Self-pay | Admitting: Family Medicine

## 2016-01-04 VITALS — BP 121/83 | HR 73 | Temp 98.1°F | Resp 20 | Wt 178.0 lb

## 2016-01-04 DIAGNOSIS — J45909 Unspecified asthma, uncomplicated: Secondary | ICD-10-CM

## 2016-01-04 MED ORDER — HYDROCODONE-HOMATROPINE 5-1.5 MG/5ML PO SYRP: 5.0000 mL | ORAL_SOLUTION | Freq: Three times a day (TID) | ORAL | 0 refills | Status: DC | PRN

## 2016-01-04 MED ORDER — DOXYCYCLINE HYCLATE 100 MG PO TABS: 100.0000 mg | ORAL_TABLET | Freq: Two times a day (BID) | ORAL | 0 refills | Status: DC

## 2016-01-04 MED ORDER — PREDNISONE 50 MG PO TABS: 50.0000 mg | ORAL_TABLET | Freq: Every day | ORAL | 0 refills | Status: DC

## 2016-01-04 NOTE — Patient Instructions (Signed)
I have called in doxycyline and prednisone. Use as directed.  Rest and hydrate.  Cough syrup at night.  Try mucinex  DM for daytime cough.    Acute Bronchitis Bronchitis is inflammation of the airways that extend from the windpipe into the lungs (bronchi). The inflammation often causes mucus to develop. This leads to a cough, which is the most common symptom of bronchitis.  In acute bronchitis, the condition usually develops suddenly and goes away over time, usually in a couple weeks. Smoking, allergies, and asthma can make bronchitis worse. Repeated episodes of bronchitis may cause further lung problems.  CAUSES Acute bronchitis is most often caused by the same virus that causes a cold. The virus can spread from person to person (contagious) through coughing, sneezing, and touching contaminated objects. SIGNS AND SYMPTOMS   Cough.   Fever.   Coughing up mucus.   Body aches.   Chest congestion.   Chills.   Shortness of breath.   Sore throat.  DIAGNOSIS  Acute bronchitis is usually diagnosed through a physical exam. Your health care provider will also ask you questions about your medical history. Tests, such as chest X-rays, are sometimes done to rule out other conditions.  TREATMENT  Acute bronchitis usually goes away in a couple weeks. Oftentimes, no medical treatment is necessary. Medicines are sometimes given for relief of fever or cough. Antibiotic medicines are usually not needed but may be prescribed in certain situations. In some cases, an inhaler may be recommended to help reduce shortness of breath and control the cough. A cool mist vaporizer may also be used to help thin bronchial secretions and make it easier to clear the chest.  HOME CARE INSTRUCTIONS  Get plenty of rest.   Drink enough fluids to keep your urine clear or pale yellow (unless you have a medical condition that requires fluid restriction). Increasing fluids may help thin your respiratory  secretions (sputum) and reduce chest congestion, and it will prevent dehydration.   Take medicines only as directed by your health care provider.  If you were prescribed an antibiotic medicine, finish it all even if you start to feel better.  Avoid smoking and secondhand smoke. Exposure to cigarette smoke or irritating chemicals will make bronchitis worse. If you are a smoker, consider using nicotine gum or skin patches to help control withdrawal symptoms. Quitting smoking will help your lungs heal faster.   Reduce the chances of another bout of acute bronchitis by washing your hands frequently, avoiding people with cold symptoms, and trying not to touch your hands to your mouth, nose, or eyes.   Keep all follow-up visits as directed by your health care provider.  SEEK MEDICAL CARE IF: Your symptoms do not improve after 1 week of treatment.  SEEK IMMEDIATE MEDICAL CARE IF:  You develop an increased fever or chills.   You have chest pain.   You have severe shortness of breath.  You have bloody sputum.   You develop dehydration.  You faint or repeatedly feel like you are going to pass out.  You develop repeated vomiting.  You develop a severe headache. MAKE SURE YOU:   Understand these instructions.  Will watch your condition.  Will get help right away if you are not doing well or get worse.   This information is not intended to replace advice given to you by your health care provider. Make sure you discuss any questions you have with your health care provider.   Document Released: 03/23/2004 Document Revised:  03/06/2014 Document Reviewed: 08/06/2012 Elsevier Interactive Patient Education [employer].

## 2016-01-04 NOTE — Progress Notes (Signed)
TORAO CASPER , Feb 01, 1971, 45 y.o., male MRN: [id number] Patient Care Team    Relationship Specialty Notifications Start End  Tammi Sou, MD PCP - General Family Medicine  01/06/11   Dene Gentry, MD Consulting Physician Sports Medicine  08/12/15     CC: cough  Subjective: Pt presents for an acute OV with complaints of cough of 2 days duration.  Associated symptoms include runny nose and fatigue.  He denies  fever, chills, nausea, vomit, headache or diarrhea. He states he does have productive cough. He denies shortness of breath or wheezing. He has been using tylenol and some old cough syrup to help with his symptoms. He states his 2 children at home are ill.   No Known Allergies Social History  Substance Use Topics  . Smoking status: Former Smoker    Types: Pipe, Landscape architect  . Smokeless tobacco: Never Used  . Alcohol use 0.0 oz/week   Past Medical History:  Diagnosis Date  . Arthritis    Primarily knees, mild osteo  . Gout    2 attacks in right MTP joint around 2005; 2 more flares in foot/toe 2013  . IFG (impaired fasting glucose)    Fasting gluc 101-107  . Low testosterone   . Overweight (BMI 25.0-29.9)   . 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 Right shoulder pain 07/2015   Right AC synovitis (Dr. Barbaraann Barthel)  . Wrist pain    For 2-3 years in his 23s.  Extensive multidisciplinary w/u at [facility] and [facility] revealed NO ABNORMALITY.  Improved with ibuprofen and essentially has spontaneously resolved.   History reviewed. No pertinent surgical history. Family History  Problem Relation Age of Onset  . Arthritis Mother     osteo (no gout)  . Arthritis Father     osteo (no gout)     Medication List       Accurate as of 01/04/16  4:30 PM. Always use your most recent med list.          albuterol 108 (90 Base) MCG/ACT inhaler Commonly known as:  VENTOLIN HFA Inhale 2 puffs into the lungs every 4 (four) hours as needed for wheezing or shortness of breath. 1-2 puffs q6h  prn cough/chest tightness/wheezing/shortness of breath   allopurinol 300 MG tablet Commonly known as:  ZYLOPRIM Take 1 tablet (300 mg total) by mouth 2 (two) times daily.   colchicine 0.6 MG tablet Commonly known as:  COLCRYS 2 tabs at onset of gout flare, then 1 tab one hour later. Then 1 tab po bid starting on day #2 of flare and continue until no pain       No results found for this or any previous visit (from the past 24 hour(s)). No results found.   ROS: Negative, with the exception of above mentioned in HPI   Objective:  BP 121/83 (BP Location: Right Arm, Patient Position: Sitting, Cuff Size: Large)   Pulse 73   Temp 98.1 F (36.7 C)   Resp 20   Wt 178 lb (80.7 kg)   SpO2 97%   BMI 26.29 kg/m  Body mass index is 26.29 kg/m. Gen: Afebrile. No acute distress. Nontoxic in appearance, well developed, well nourished. Male.  HENT: AT. [place]. Bilateral TM visualized WNL. MMM, no oral lesions. Bilateral nares mild erythema, no swelling. Throat without erythema or exudates. Moderate cough and hoarseness present.  Eyes:Pupils Equal Round Reactive to light, Extraocular movements intact,  Conjunctiva without redness, discharge or icterus. Neck/lymp/endocrine: Supple,mild ant  cervical  lymphadenopathy CV: RRR  Chest: CTAB, no wheeze or crackles. Good air movement, normal resp effort.  Abd: Soft. NTND. BS present.  Skin: no rashes, purpura or petechiae.  Neuro:  Normal gait. PERLA. EOMi. Alert. Oriented x3  Psych: Normal affect, dress and demeanor. Normal speech. Normal thought content and judgment.  Assessment/Plan: AKHEEM SCHMITZER is a 45 y.o. male present for acute OV for  Asthmatic bronchitis without complication, unspecified asthma severity, unspecified whether persistent Doxycycline, prednisone, hycodan (no refills will be provided).  Rest and hydrate. mucinex DM during the day. +/- flonase.  F/u prn or with PCP in 1 week if symptoms are worsening or not resolved.     electronically signed by:  Howard Pouch, DO  [place]

## 2016-05-03 ENCOUNTER — Encounter: Payer: Self-pay | Admitting: *Deleted

## 2016-05-05 ENCOUNTER — Ambulatory Visit (INDEPENDENT_AMBULATORY_CARE_PROVIDER_SITE_OTHER): Payer: 59

## 2016-05-05 DIAGNOSIS — Z23 Encounter for immunization: Secondary | ICD-10-CM

## 2016-08-01 ENCOUNTER — Other Ambulatory Visit: Payer: Self-pay | Admitting: *Deleted

## 2016-08-01 ENCOUNTER — Telehealth: Payer: Self-pay | Admitting: Family Medicine

## 2016-08-01 MED ORDER — COLCHICINE 0.6 MG PO TABS: ORAL_TABLET | ORAL | 1 refills | Status: DC

## 2016-08-01 NOTE — Telephone Encounter (Signed)
Will do RF for colchicine but pt needs either a f/u visit for gout (15 min) or appt for fasting CPE (30 min) in the next 2-3 months.--thx

## 2016-08-01 NOTE — Telephone Encounter (Signed)
Rx faxed

## 2016-08-01 NOTE — Telephone Encounter (Signed)
See refill note

## 2016-08-01 NOTE — Telephone Encounter (Signed)
Pt advised and voiced understanding. CPE scheduled for 09/08/16 at 9:00am.

## 2016-08-01 NOTE — Telephone Encounter (Signed)
Patient states he is returning phone call to [facility].  Advised patient Nira Conn was unavailable at the time.  Pt requesting call back at [phone].

## 2016-08-01 NOTE — Telephone Encounter (Signed)
Pt called requesting refill for colchicine.   RF request for colchicine LOV: no recent f/u for gout Next ov: None Last written: 02/11/13 #60 w/ 1RF  Please advise. Thanks.

## 2016-08-01 NOTE — Telephone Encounter (Signed)
Left message for pt to call back  °

## 2016-08-03 ENCOUNTER — Telehealth: Payer: Self-pay

## 2016-08-03 MED ORDER — PREDNISONE 20 MG PO TABS: ORAL_TABLET | ORAL | 0 refills | Status: DC

## 2016-08-03 NOTE — Telephone Encounter (Signed)
Will eRx prednisone. Still needs f/u like I recommended in earlier telephone note.--thx

## 2016-08-03 NOTE — Telephone Encounter (Signed)
Detailed message left on voice mail. Okay per DPR. 

## 2016-08-03 NOTE — Telephone Encounter (Signed)
Patient called regarding Cochicine being denied by insurance.  He is asking if there is something else he can take for his gout flare up. Patient stated that he is not taking anything for gout currently.

## 2016-09-08 ENCOUNTER — Encounter: Payer: Self-pay | Admitting: Family Medicine

## 2016-09-08 ENCOUNTER — Ambulatory Visit (INDEPENDENT_AMBULATORY_CARE_PROVIDER_SITE_OTHER): Payer: 59 | Admitting: Family Medicine

## 2016-09-08 VITALS — BP 115/75 | HR 57 | Temp 98.1°F | Resp 16 | Ht 69.0 in | Wt 174.5 lb

## 2016-09-08 DIAGNOSIS — Z0000 Encounter for general adult medical examination without abnormal findings: Secondary | ICD-10-CM | POA: Diagnosis not present

## 2016-09-08 DIAGNOSIS — R7301 Impaired fasting glucose: Secondary | ICD-10-CM

## 2016-09-08 DIAGNOSIS — Z23 Encounter for immunization: Secondary | ICD-10-CM

## 2016-09-08 DIAGNOSIS — M1000 Idiopathic gout, unspecified site: Secondary | ICD-10-CM

## 2016-09-08 DIAGNOSIS — Z114 Encounter for screening for human immunodeficiency virus [HIV]: Secondary | ICD-10-CM | POA: Diagnosis not present

## 2016-09-08 LAB — COMPREHENSIVE METABOLIC PANEL
ALBUMIN: 4.5 g/dL (ref 3.5–5.2)
ALK PHOS: 64 U/L (ref 39–117)
ALT: 13 U/L (ref 0–53)
AST: 14 U/L (ref 0–37)
BILIRUBIN TOTAL: 0.9 mg/dL (ref 0.2–1.2)
BUN: 13 mg/dL (ref 6–23)
CHLORIDE: 106 meq/L (ref 96–112)
CO2: 28 mEq/L (ref 19–32)
CREATININE: 1.02 mg/dL (ref 0.40–1.50)
Calcium: 9.6 mg/dL (ref 8.4–10.5)
GFR: 83.17 mL/min (ref >60.00)
Glucose, Bld: 114 mg/dL — ABNORMAL HIGH (ref 70–99)
Potassium: 4.9 mEq/L (ref 3.5–5.1)
SODIUM: 141 meq/L (ref 135–145)
TOTAL PROTEIN: 6.7 g/dL (ref 6.0–8.3)

## 2016-09-08 LAB — LIPID PANEL
CHOLESTEROL: 161 mg/dL (ref 0–200)
HDL: 46.7 mg/dL (ref >39.00)
LDL Cholesterol: 96 mg/dL (ref 0–99)
NonHDL: 114.4
TRIGLYCERIDES: 92 mg/dL (ref 0.0–149.0)
Total CHOL/HDL Ratio: 3
VLDL: 18.4 mg/dL (ref 0.0–40.0)

## 2016-09-08 LAB — CBC WITH DIFFERENTIAL/PLATELET
BASOS ABS: 0 10*3/uL (ref 0.0–0.1)
Basophils Relative: 0.5 % (ref 0.0–3.0)
EOS ABS: 0.2 10*3/uL (ref 0.0–0.7)
Eosinophils Relative: 3.5 % (ref 0.0–5.0)
HCT: 44.6 % (ref 39.0–52.0)
Hemoglobin: 14.5 g/dL (ref 13.0–17.0)
LYMPHS ABS: 1.8 10*3/uL (ref 0.7–4.0)
LYMPHS PCT: 34.8 % (ref 12.0–46.0)
MCHC: 32.6 g/dL (ref 30.0–36.0)
MCV: 85.1 fl (ref 78.0–100.0)
Monocytes Absolute: 0.4 10*3/uL (ref 0.1–1.0)
Monocytes Relative: 7.5 % (ref 3.0–12.0)
NEUTROS ABS: 2.7 10*3/uL (ref 1.4–7.7)
NEUTROS PCT: 53.7 % (ref 43.0–77.0)
PLATELETS: 150 10*3/uL (ref 150.0–400.0)
RBC: 5.23 Mil/uL (ref 4.22–5.81)
RDW: 13.5 % (ref 11.5–15.5)
WBC: 5.1 10*3/uL (ref 4.0–10.5)

## 2016-09-08 LAB — URIC ACID: URIC ACID, SERUM: 7.9 mg/dL — AB (ref 4.0–7.8)

## 2016-09-08 LAB — TSH: TSH: 2.09 u[IU]/mL (ref 0.35–4.50)

## 2016-09-08 LAB — HEMOGLOBIN A1C: Hgb A1c MFr Bld: 5.6 % (ref 4.6–6.5)

## 2016-09-08 MED ORDER — ALLOPURINOL 300 MG PO TABS: 300.0000 mg | ORAL_TABLET | Freq: Two times a day (BID) | ORAL | 3 refills | Status: DC

## 2016-09-08 NOTE — Addendum Note (Signed)
Addended by: Onalee Hua on: 09/08/2016 09:22 AM   Modules accepted: Orders

## 2016-09-08 NOTE — Progress Notes (Signed)
Office Note 09/08/2016  CC:  Chief Complaint  Patient presents with  . Annual Exam    Pt is fasting.     HPI:  Johnny Atkins is a 46 y.o.  male who is here for annual health maintenance exam. Had gout attack recently prior to going to [place].  Colchicine not on formulary anymore so he got some from [place]---much cheaper so he is going to take these (0.5mg  tabs).  Has not taken the allopurinol in the last 6 mo--says he never took it regularly.  He is not a medication type person, he admits.  Complains today a lot about the cost of health care, meds, etc.  Still exercising regularly. Working on diet and this is already pretty healthy.  Eyes: annual exam. Dental: q 6 mo.   Past Medical History:  Diagnosis Date  . Arthritis    Primarily knees, mild osteo  . Gout    2 attacks in right MTP joint around June 16, 2003; 2 more flares in foot/toe Jun 16, 2011  . IFG (impaired fasting glucose)    Fasting gluc 101-107  . Low testosterone   . Mild intermittent asthma   . Overweight (BMI 25.0-29.9)   . 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 Right shoulder pain 07/2015   Right AC synovitis (Dr. Barbaraann Barthel)  . Wrist pain    For 2-3 years in his 60s.  Extensive multidisciplinary w/u at [facility] and [facility] revealed NO ABNORMALITY.  Improved with ibuprofen and essentially has spontaneously resolved.    History reviewed. No pertinent surgical history.  Family History  Problem Relation Age of Onset  . Arthritis Mother        osteo (no gout)  . Arthritis Father        osteo (no gout)    Social History   Social History  . Marital status: Married    Spouse name: N/A  . Number of children: N/A  . Years of education: N/A   Occupational History  . Not on file.   Social History Main Topics  . Smoking status: Former Smoker    Types: Pipe, Landscape architect  . Smokeless tobacco: Never Used  . Alcohol use 0.0 oz/week  . Drug use: No  . Sexual activity: Not on file   Other Topics Concern  . Not on file    Social History Narrative   Married, 2 daughters (45 y/o and 23 y/o).   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   Regular Exercise: 1-2 days per week (elliptical machine 30 min).  He is a vegetarian (tends to eat lots of lentils, bread, rice).     No cigarettes but occasional cigar/pipe.  Two alcohol drinks per week (beer or whisky).   Born in [place], moved to [place] in [place] 16, [place].  He is the youngest of 5 siblings.  All siblings healthy but one died in June 16, 1998 of a "stomach problem" rather suddenly.   ?BCG given in [place]?  Positive PPD here in 2003-06-16, CXR neg--no treatment.             Outpatient Medications Prior to Visit  Medication Sig Dispense Refill  . albuterol (VENTOLIN HFA) 108 (90 BASE) MCG/ACT inhaler Inhale 2 puffs into the lungs every 4 (four) hours as needed for wheezing or shortness of breath. 1-2 puffs q6h prn cough/chest tightness/wheezing/shortness of breath 1 Inhaler 3  . allopurinol (ZYLOPRIM) 300 MG tablet Take 1 tablet (300 mg total) by mouth 2 (two) times daily. Gregory  tablet 6  . colchicine (COLCRYS) 0.6 MG tablet 2 tabs at onset of gout flare, then 1 tab one hour later. Then 1 tab po bid starting on day #2 of flare and continue until no pain (Patient not taking: Reported on 09/08/2016) 60 tablet 1  . doxycycline (VIBRA-TABS) 100 MG tablet Take 1 tablet (100 mg total) by mouth 2 (two) times daily. (Patient not taking: Reported on 09/08/2016) 14 tablet 0  . HYDROcodone-homatropine (HYCODAN) 5-1.5 MG/5ML syrup Take 5 mLs by mouth every 8 (eight) hours as needed for cough. (Patient not taking: Reported on 09/08/2016) 120 mL 0  . predniSONE (DELTASONE) 20 MG tablet 2 tabs po qd x 5d (Patient not taking: Reported on 09/08/2016) 10 tablet 0   No facility-administered medications prior to visit.     No Known Allergies  ROS Review of Systems  Constitutional: Negative for appetite change, chills, fatigue and fever.  HENT: Negative for congestion,  dental problem, ear pain and sore throat.   Eyes: Negative for discharge, redness and visual disturbance.  Respiratory: Negative for cough, chest tightness, shortness of breath and wheezing.   Cardiovascular: Negative for chest pain, palpitations and leg swelling.  Gastrointestinal: Negative for abdominal pain, blood in stool, diarrhea, nausea and vomiting.  Genitourinary: Negative for difficulty urinating, dysuria, flank pain, frequency, hematuria and urgency.  Musculoskeletal: Negative for arthralgias, back pain, joint swelling, myalgias and neck stiffness.  Skin: Negative for pallor and rash.  Neurological: Negative for dizziness, speech difficulty, weakness and headaches.  Hematological: Negative for adenopathy. Does not bruise/bleed easily.  Psychiatric/Behavioral: Negative for confusion and sleep disturbance. The patient is not nervous/anxious.     PE; Blood pressure 115/75, pulse (!) 57, temperature 98.1 F (36.7 C), temperature source Oral, resp. rate 16, height 5\' 9"  (1.753 m), weight 174 lb 8 oz (79.2 kg), SpO2 98 %. Body mass index is 25.77 kg/m.  Gen: Alert, well appearing.  Patient is oriented to person, place, time, and situation. AFFECT: pleasant, lucid thought and speech. ENT: Ears: EACs clear, normal epithelium.  TMs with good light reflex and landmarks bilaterally.  Eyes: no injection, icteris, swelling, or exudate.  EOMI, PERRLA. Nose: no drainage or turbinate edema/swelling.  No injection or focal lesion.  Mouth: lips without lesion/swelling.  Oral mucosa pink and moist.  Dentition intact and without obvious caries or gingival swelling.  Oropharynx without erythema, exudate, or swelling.  Neck: supple/nontender.  No LAD, mass, or TM.  Carotid pulses 2+ bilaterally, without bruits. CV: RRR, no m/r/g.   LUNGS: CTA bilat, nonlabored resps, good aeration in all lung fields. ABD: soft, NT, ND, BS normal.  No hepatospenomegaly or mass.  No bruits. EXT: no clubbing, cyanosis,  or edema.  Musculoskeletal: no joint swelling, erythema, warmth, or tenderness.  ROM of all joints intact. Skin - no sores or suspicious lesions or rashes or color changes   Pertinent labs:  Lab Results  Component Value Date   TSH 3.17 04/08/2014   Lab Results  Component Value Date   WBC 6.7 04/08/2014   HGB 14.0 04/08/2014   HCT 41.9 04/08/2014   MCV 83.4 04/08/2014   PLT 221.0 04/08/2014   Lab Results  Component Value Date   CREATININE 0.95 04/08/2014   BUN 14 04/08/2014   NA 140 04/08/2014   K 5.1 04/08/2014   CL 106 04/08/2014   CO2 29 04/08/2014   Lab Results  Component Value Date   ALT 17 04/08/2014   AST 12 04/08/2014   ALKPHOS 98  04/08/2014   BILITOT 0.6 04/08/2014   Lab Results  Component Value Date   CHOL 148 04/08/2014   Lab Results  Component Value Date   HDL 36.60 (L) 04/08/2014   Lab Results  Component Value Date   LDLCALC 92 04/08/2014   Lab Results  Component Value Date   TRIG 97.0 04/08/2014   Lab Results  Component Value Date   CHOLHDL 4 04/08/2014   Lab Results  Component Value Date   LABURIC 7.9 (H) 02/11/2013   ASSESSMENT AND PLAN:   1) Gouty arthritis, chronic/recurrent: emphasized need for pt to take his preventative med---new rx sent in for allopurinol. Take 300 mg qd x 10d, then 300 mg bid EVERY DAY.  He'll take the colchicine 0.5mg  he gets from [place] prn gout flare.  2) Health maintenance exam: Reviewed age and gender appropriate health maintenance issues (prudent diet, regular exercise, health risks of tobacco and excessive alcohol, use of seatbelts, fire alarms in home, use of sunscreen).  Also reviewed age and gender appropriate health screening as well as vaccine recommendations. Vaccines: Tdap needed--given today. Labs: fasting HP + HbA1c (hx of IFG) and uric acid (gout) and HIV screen.  An After Visit Summary was printed and given to the patient.  FOLLOW UP:  Return in about 1 year (around 09/08/2017) for annual CPE  (fasting).  Signed:  Crissie Sickles, MD           09/08/2016

## 2016-09-08 NOTE — Patient Instructions (Signed)

## 2016-09-09 LAB — HIV ANTIBODY (ROUTINE TESTING W REFLEX): HIV: NONREACTIVE

## 2016-09-11 ENCOUNTER — Other Ambulatory Visit: Payer: Self-pay | Admitting: *Deleted

## 2016-09-11 ENCOUNTER — Encounter: Payer: Self-pay | Admitting: *Deleted

## 2016-09-11 DIAGNOSIS — R7301 Impaired fasting glucose: Secondary | ICD-10-CM | POA: Insufficient documentation

## 2016-09-11 NOTE — Telephone Encounter (Signed)
Set up lab appt for fasting glucose level in 2 months (have him fast for 12 hours+ drink lots of water), dx is impaired fasting glucose.-thx

## 2016-09-11 NOTE — Telephone Encounter (Signed)
Please advise. Thanks.  

## 2017-05-24 ENCOUNTER — Ambulatory Visit: Payer: 59 | Admitting: Family Medicine

## 2017-05-25 ENCOUNTER — Encounter: Payer: Self-pay | Admitting: Family Medicine

## 2017-05-25 ENCOUNTER — Ambulatory Visit: Payer: 59 | Admitting: Family Medicine

## 2017-05-25 VITALS — BP 120/82 | HR 81 | Temp 98.2°F | Resp 16 | Wt 183.0 lb

## 2017-05-25 DIAGNOSIS — J069 Acute upper respiratory infection, unspecified: Secondary | ICD-10-CM

## 2017-05-25 DIAGNOSIS — B9789 Other viral agents as the cause of diseases classified elsewhere: Secondary | ICD-10-CM | POA: Diagnosis not present

## 2017-05-25 MED ORDER — BENZONATATE 200 MG PO CAPS: 200.0000 mg | ORAL_CAPSULE | Freq: Two times a day (BID) | ORAL | 0 refills | Status: DC | PRN

## 2017-05-25 MED ORDER — HYDROCODONE-HOMATROPINE 5-1.5 MG/5ML PO SYRP
5.0000 mL | ORAL_SOLUTION | Freq: Every day | ORAL | 0 refills | Status: DC
Start: 2017-05-25 — End: 2017-11-23

## 2017-05-25 MED ORDER — AZITHROMYCIN 250 MG PO TABS: ORAL_TABLET | ORAL | 0 refills | Status: DC

## 2017-05-25 NOTE — Progress Notes (Signed)
Johnny Atkins , [birth year], 47 y.o., male MRN: [id number] Patient Care Team    Relationship Specialty Notifications Start End  McGowen, Adrian Blackwater, MD PCP - General Family Medicine  01/06/11   Dene Gentry, MD Consulting Physician Sports Medicine  08/12/15     Chief Complaint  Patient presents with  . Cough     Subjective: Pt presents for an OV with complaints of cough  of 4 days duration. Associated symptoms include burning sensation with cough. Cyclic cough.  He denies nausea, vomit, headache, sore throat.  He endorses chills 2 days, nasal drianage. He feels like he may be improving from all symptoms with the exception of cough not allowing him to sleep at night.  Pt has tried zyrtec and nyquil  to ease their symptoms. He has not need his albuterol inhaler.   No flowsheet data found.  No Known Allergies Social History   Tobacco Use  . Smoking status: Former Smoker    Types: Pipe, Landscape architect  . Smokeless tobacco: Never Used  Substance Use Topics  . Alcohol use: Yes    Alcohol/week: 0.0 oz   Past Medical History:  Diagnosis Date  . Arthritis    Primarily knees, mild osteo  . Gout    2 attacks in right MTP joint around 2005; 2 more flares in foot/toe 2013  . IFG (impaired fasting glucose)    Fasting gluc 101-107  . Low testosterone   . Mild intermittent asthma   . Overweight (BMI 25.0-29.9)   . 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 Right shoulder pain 07/2015   Right AC synovitis (Dr. Barbaraann Barthel)  . Wrist pain    For 2-3 years in his 80s.  Extensive multidisciplinary w/u at [facility] and [facility] revealed NO ABNORMALITY.  Improved with ibuprofen and essentially has spontaneously resolved.   No past surgical history on file. Family History  Problem Relation Age of Onset  . Arthritis Mother        osteo (no gout)  . Arthritis Father        osteo (no gout)   Allergies as of 05/25/2017   No Known Allergies     Medication List        Accurate as of 05/25/17  1:57 PM.  Always use your most recent med list.          albuterol 108 (90 Base) MCG/ACT inhaler Commonly known as:  VENTOLIN HFA Inhale 2 puffs into the lungs every 4 (four) hours as needed for wheezing or shortness of breath. 1-2 puffs q6h prn cough/chest tightness/wheezing/shortness of breath   allopurinol 300 MG tablet Commonly known as:  ZYLOPRIM Take 1 tablet (300 mg total) by mouth 2 (two) times daily.       All past medical history, surgical history, allergies, family history, immunizations andmedications were updated in the EMR today and reviewed under the history and medication portions of their EMR.     ROS: Negative, with the exception of above mentioned in HPI   Objective:  BP 120/82 (BP Location: Right Arm, Patient Position: Sitting, Cuff Size: Normal)   Pulse 81   Temp 98.2 F (36.8 C) (Oral)   Resp 16   Wt 183 lb (83 kg)   SpO2 98%   BMI 27.02 kg/m  Body mass index is 27.02 kg/m. Gen: Afebrile. No acute distress. Nontoxic in appearance, well developed, well nourished.  HENT: AT. [place]. Bilateral TM visualized with fullness, no erythema. . MMM, no oral lesions. Bilateral nares  with very mild erythema . Throat without erythema or exudates. paroxsymal cough. No hoarseness.  Eyes:Pupils Equal Round Reactive to light, Extraocular movements intact,  Conjunctiva without redness, discharge or icterus. Neck/lymp/endocrine: Supple,no lymphadenopathy CV: RRR  Chest: CTAB, no wheeze or crackles. Good air movement, normal resp effort.  Neuro:  Normal gait. PERLA. EOMi. Alert. Oriented x3  No exam data present No results found. No results found for this or any previous visit (from the past 24 hour(s)).  Assessment/Plan: Johnny Atkins is a 47 y.o. male present for OV for  Viral URI with cough Rest, hydrate.  + flonase, mucinex (DM if cough), nettie pot or nasal saline.  Tessalon perles and hycodan for cough.  Z-pack to start only if worsening over weekend, if started take until  completed.  If cough present it can last up to 6-8 weeks.  F/U 2 weeks of not improved.     Reviewed expectations re: course of current medical issues.  Discussed self-management of symptoms.  Outlined signs and symptoms indicating need for more acute intervention.  Patient verbalized understanding and all questions were answered.  Patient received an After-Visit Summary.    No orders of the defined types were placed in this encounter.    Note is dictated utilizing voice recognition software. Although note has been proof read prior to signing, occasional typographical errors still can be missed. If any questions arise, please do not hesitate to call for verification.   electronically signed by:  Howard Pouch, DO  [place]

## 2017-05-25 NOTE — Patient Instructions (Signed)
Rest, hydrate.  + flonase, mucinex (DM if cough), nettie pot or nasal saline.  Continue zyrtec Z-pack prescribed, take until completed if started. Hold on to script and start only if not improving over the weekend.  Hycodan cough syrup at night, no refills.  If cough present it can last up to 6-8 weeks.  F/U 2 weeks of not improved.     Viral Respiratory Infection A viral respiratory infection is an illness that affects parts of the body used for breathing, like the lungs, nose, and throat. It is caused by a germ called a virus. Some examples of this kind of infection are:  A cold.  The flu (influenza).  A respiratory syncytial virus (RSV) infection.  How do I know if I have this infection? Most of the time this infection causes:  A stuffy or runny nose.  Yellow or green fluid in the nose.  A cough.  Sneezing.  Tiredness (fatigue).  Achy muscles.  A sore throat.  Sweating or chills.  A fever.  A headache.  How is this infection treated? If the flu is diagnosed early, it may be treated with an antiviral medicine. This medicine shortens the length of time a person has symptoms. Symptoms may be treated with over-the-counter and prescription medicines, such as:  Expectorants. These make it easier to cough up mucus.  Decongestant nasal sprays.  Doctors do not prescribe antibiotic medicines for viral infections. They do not work with this kind of infection. How do I know if I should stay home? To keep others from getting sick, stay home if you have:  A fever.  A lasting cough.  A sore throat.  A runny nose.  Sneezing.  Muscles aches.  Headaches.  Tiredness.  Weakness.  Chills.  Sweating.  An upset stomach (nausea).  Follow these instructions at home:  Rest as much as possible.  Take over-the-counter and prescription medicines only as told by your doctor.  Drink enough fluid to keep your pee (urine) clear or pale yellow.  Gargle with salt  water. Do this 3-4 times per day or as needed. To make a salt-water mixture, dissolve -1 tsp of salt in 1 cup of warm water. Make sure the salt dissolves all the way.  Use nose drops made from salt water. This helps with stuffiness (congestion). It also helps soften the skin around your nose.  Do not drink alcohol.  Do not use tobacco products, including cigarettes, chewing tobacco, and e-cigarettes. If you need help quitting, ask your doctor. Get help if:  Your symptoms last for 10 days or longer.  Your symptoms get worse over time.  You have a fever.  You have very bad pain in your face or forehead.  Parts of your jaw or neck become very swollen. Get help right away if:  You feel pain or pressure in your chest.  You have shortness of breath.  You faint or feel like you will faint.  You keep throwing up (vomiting).  You feel confused. This information is not intended to replace advice given to you by your health care provider. Make sure you discuss any questions you have with your health care provider. Document Released: 01/27/2008 Document Revised: 07/22/2015 Document Reviewed: 07/22/2014 Elsevier Interactive Patient Education  2018 [employer].

## 2017-09-10 ENCOUNTER — Telehealth: Payer: Self-pay | Admitting: *Deleted

## 2017-09-10 ENCOUNTER — Ambulatory Visit: Payer: 59 | Admitting: Family Medicine

## 2017-09-10 DIAGNOSIS — M542 Cervicalgia: Secondary | ICD-10-CM | POA: Diagnosis not present

## 2017-09-10 DIAGNOSIS — Z0289 Encounter for other administrative examinations: Secondary | ICD-10-CM

## 2017-09-10 DIAGNOSIS — M532X2 Spinal instabilities, cervical region: Secondary | ICD-10-CM | POA: Diagnosis not present

## 2017-09-10 NOTE — Telephone Encounter (Signed)
Pt needs to be evaluated before we can determine if xray is needed. Pt needs apt. Please call to schedule.

## 2017-09-10 NOTE — Telephone Encounter (Signed)
Copied from [place] [phone] [phone]. Topic: Inquiry >> Sep 10, 2017  8:22 AM Conception Chancy, NT wrote: Reason for CRM: patient is calling and states he woke up with neck pain. He states he wants to get a xray done. I offered patient an appointment and he asked if the office had xray machine and I informed him no the office did not we would send him to [facility] if that is what Dr. Anitra Lauth felt was needed. He wanted me to send a message because he wants Dr. Anitra Lauth opinion on where he should go or be referred to where he can get a xray done.

## 2017-09-10 NOTE — Telephone Encounter (Signed)
Patient made an appointment however is going to call [facility] to see if they have any openings this afternoon. He will send a MyChart message to cancel the appointment if they can get him in today. He would like to get his results this evening. Offerred an appointment at [facility]. Patient states he only wants to see Dr. Anitra Lauth or this ortho doctor.

## 2017-11-23 ENCOUNTER — Ambulatory Visit (INDEPENDENT_AMBULATORY_CARE_PROVIDER_SITE_OTHER): Payer: 59 | Admitting: Family Medicine

## 2017-11-23 ENCOUNTER — Encounter: Payer: Self-pay | Admitting: Family Medicine

## 2017-11-23 VITALS — BP 125/54 | HR 61 | Temp 98.2°F | Resp 16 | Ht 69.5 in | Wt 176.4 lb

## 2017-11-23 DIAGNOSIS — Z0000 Encounter for general adult medical examination without abnormal findings: Secondary | ICD-10-CM

## 2017-11-23 DIAGNOSIS — Z23 Encounter for immunization: Secondary | ICD-10-CM | POA: Diagnosis not present

## 2017-11-23 DIAGNOSIS — R7301 Impaired fasting glucose: Secondary | ICD-10-CM | POA: Diagnosis not present

## 2017-11-23 DIAGNOSIS — R195 Other fecal abnormalities: Secondary | ICD-10-CM

## 2017-11-23 DIAGNOSIS — E663 Overweight: Secondary | ICD-10-CM

## 2017-11-23 MED ORDER — COLCHICINE 0.6 MG PO TABS: ORAL_TABLET | ORAL | 1 refills | Status: DC

## 2017-11-23 NOTE — Patient Instructions (Signed)

## 2017-11-23 NOTE — Progress Notes (Signed)
Office Note 11/23/2017  CC:  Chief Complaint  Patient presents with  . Annual Exam    Pt is not fasting.     HPI:  Johnny Atkins is a 47 y.o. male who is here for annual health maintenance exam. Feels good, but wants to discuss loose stools lately. Does note that his stools are a bit looser lately, but no increase in frequency of BMs and no abd pains.  No n/v.  The stools are not watery or explosive.   He is a long time vegetarian, but lately has been adding protein shakes---his change in BMs started after he started his protein shakes. No abd pains, no blood in stool, no pus in stool.  No fevers.  Exercises regularly---running 15-20 miles per week.   Past Medical History:  Diagnosis Date  . Arthritis    Primarily knees, mild osteo  . Gout    2 attacks in right MTP joint around 2005; 2 more flares in foot/toe 2013  . IFG (impaired fasting glucose)    Fasting gluc 101-107  . Low testosterone   . Mild intermittent asthma   . Overweight (BMI 25.0-29.9)   . 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 Right shoulder pain 07/2015   Right AC synovitis (Dr. Barbaraann Barthel)  . Wrist pain    For 2-3 years in his 34s.  Extensive multidisciplinary w/u at [facility] and [facility] revealed NO ABNORMALITY.  Improved with ibuprofen and essentially has spontaneously resolved.    History reviewed. No pertinent surgical history.  Family History  Problem Relation Age of Onset  . Arthritis Mother        osteo (no gout)  . Arthritis Father        osteo (no gout)    Social History   Socioeconomic History  . Marital status: Married    Spouse name: Not on file  . Number of children: Not on file  . Years of education: Not on file  . Highest education level: Not on file  Occupational History  . Not on file  Social Needs  . Financial resource strain: Not on file  . Food insecurity:    Worry: Not on file    Inability: Not on file  . Transportation needs:    Medical: Not on file    Non-medical:  Not on file  Tobacco Use  . Smoking status: Former Smoker    Types: Pipe, Landscape architect  . Smokeless tobacco: Never Used  Substance and Sexual Activity  . Alcohol use: Yes    Alcohol/week: 0.0 standard drinks  . Drug use: No  . Sexual activity: Not on file  Lifestyle  . Physical activity:    Days per week: Not on file    Minutes per session: Not on file  . Stress: Not on file  Relationships  . Social connections:    Talks on phone: Not on file    Gets together: Not on file    Attends religious service: Not on file    Active member of club or organization: Not on file    Attends meetings of clubs or organizations: Not on file    Relationship status: Not on file  . Intimate partner violence:    Fear of current or ex partner: Not on file    Emotionally abused: Not on file    Physically abused: Not on file    Forced sexual activity: Not on file  Other Topics Concern  . Not on file  Social History Narrative  Married, 2 daughters (18 y/o and 74 y/o).   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   Regular Exercise: 1-2 days per week (elliptical machine 30 min).  He is a vegetarian (tends to eat lots of lentils, bread, rice).     No cigarettes but occasional cigar/pipe.  Two alcohol drinks per week (beer or whisky).   Born in [place], moved to [place] in 20-Jun[place].  He is the youngest of 5 siblings.  All siblings healthy but one died in 06/20/98 of a "stomach problem" rather suddenly.   ?BCG given in [place]?  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          Outpatient Medications Prior to Visit  Medication Sig Dispense Refill  . albuterol (VENTOLIN HFA) 108 (90 BASE) MCG/ACT inhaler Inhale 2 puffs into the lungs every 4 (four) hours as needed for wheezing or shortness of breath. 1-2 puffs q6h prn cough/chest tightness/wheezing/shortness of breath (Patient not taking: Reported on 05/25/2017) 1 Inhaler 3  . allopurinol (ZYLOPRIM) 300 MG tablet Take 1  tablet (300 mg total) by mouth 2 (two) times daily. (Patient not taking: Reported on 05/25/2017) 180 tablet 3  . azithromycin (ZITHROMAX) 250 MG tablet 500 mg day 1, then 250 mg a day (Patient not taking: Reported on 11/23/2017) 6 tablet 0  . benzonatate (TESSALON) 200 MG capsule Take 1 capsule (200 mg total) by mouth 2 (two) times daily as needed for cough. (Patient not taking: Reported on 11/23/2017) 20 capsule 0  . HYDROcodone-homatropine (HYCODAN) 5-1.5 MG/5ML syrup Take 5 mLs by mouth at bedtime. (Patient not taking: Reported on 11/23/2017) 120 mL 0   No facility-administered medications prior to visit.     No Known Allergies  ROS Review of Systems  Constitutional: Negative for appetite change, chills, fatigue and fever.  HENT: Negative for congestion, dental problem, ear pain and sore throat.   Eyes: Negative for discharge, redness and visual disturbance.  Respiratory: Negative for cough, chest tightness, shortness of breath and wheezing.   Cardiovascular: Negative for chest pain, palpitations and leg swelling.  Gastrointestinal: Negative for abdominal pain, blood in stool, diarrhea, nausea and vomiting.  Genitourinary: Negative for difficulty urinating, dysuria, flank pain, frequency, hematuria and urgency.  Musculoskeletal: Negative for arthralgias, back pain, joint swelling, myalgias and neck stiffness.  Skin: Negative for pallor and rash.  Neurological: Negative for dizziness, speech difficulty, weakness and headaches.  Hematological: Negative for adenopathy. Does not bruise/bleed easily.  Psychiatric/Behavioral: Negative for confusion and sleep disturbance. The patient is not nervous/anxious.     PE; Blood pressure (!) 125/54, pulse 61, temperature 98.2 F (36.8 C), temperature source Oral, resp. rate 16, height 5' 9.5" (1.765 m), weight 176 lb 6 oz (80 kg), SpO2 98 %. Body mass index is 25.67 kg/m.  Gen: Alert, well appearing.  Patient is oriented to person, place, time, and  situation. AFFECT: pleasant, lucid thought and speech. ENT: Ears: EACs clear, normal epithelium.  TMs with good light reflex and landmarks bilaterally.  Eyes: no injection, icteris, swelling, or exudate.  EOMI, PERRLA. Nose: no drainage or turbinate edema/swelling.  No injection or focal lesion.  Mouth: lips without lesion/swelling.  Oral mucosa pink and moist.  Dentition intact and without obvious caries or gingival swelling.  Oropharynx without erythema, exudate, or swelling.  Neck: supple/nontender.  No LAD, mass, or TM.  Carotid pulses 2+ bilaterally, without bruits. CV: RRR, no m/r/g.   LUNGS: CTA bilat, nonlabored resps,  good aeration in all lung fields. ABD: soft, NT, ND, BS normal.  No hepatospenomegaly or mass.  No bruits. EXT: no clubbing, cyanosis, or edema.  Musculoskeletal: no joint swelling, erythema, warmth, or tenderness.  ROM of all joints intact. Skin - no sores or suspicious lesions or rashes or color changes   Pertinent labs:  Lab Results  Component Value Date   TSH 2.09 09/08/2016   Lab Results  Component Value Date   WBC 5.1 09/08/2016   HGB 14.5 09/08/2016   HCT 44.6 09/08/2016   MCV 85.1 09/08/2016   PLT 150.0 09/08/2016   Lab Results  Component Value Date   CREATININE 1.02 09/08/2016   BUN 13 09/08/2016   NA 141 09/08/2016   K 4.9 09/08/2016   CL 106 09/08/2016   CO2 28 09/08/2016   Lab Results  Component Value Date   ALT 13 09/08/2016   AST 14 09/08/2016   ALKPHOS 64 09/08/2016   BILITOT 0.9 09/08/2016   Lab Results  Component Value Date   CHOL 161 09/08/2016   Lab Results  Component Value Date   HDL 46.70 09/08/2016   Lab Results  Component Value Date   LDLCALC 96 09/08/2016   Lab Results  Component Value Date   TRIG 92.0 09/08/2016   Lab Results  Component Value Date   CHOLHDL 3 09/08/2016   Lab Results  Component Value Date   HGBA1C 5.6 09/08/2016    ASSESSMENT AND PLAN:    Health maintenance exam: Reviewed age and  gender appropriate health maintenance issues (prudent diet, regular exercise, health risks of tobacco and excessive alcohol, use of seatbelts, fire alarms in home, use of sunscreen).  Also reviewed age and gender appropriate health screening as well as vaccine recommendations. Vaccines: Flu vaccine-->given today. Labs: fasting HP labs, Hba1c (hx of IFG)---he has not eaten in the last 4 hours. Prostate ca screening: average risk patient= as per latest guidelines, start screening at 92 yrs of age. Colon ca screening: average risk patient= as per latest guidelines, start screening at 37 yrs of age.  Reassured pt regarding his softer/looser BMs: likely secondary to mild malabsorption of his protein shakes. Recommended either giving this more time OR stop protein shakes to see if BMs normalize. Signs/symptoms to call or return for were reviewed and pt expressed understanding.  An After Visit Summary was printed and given to the patient.  FOLLOW UP:  Return in about 1 year (around 11/24/2018) for annual CPE (fasting).  Signed:  Crissie Sickles, MD           11/23/2017

## 2017-11-24 LAB — CBC WITH DIFFERENTIAL/PLATELET
BASOS ABS: 41 {cells}/uL (ref 0–200)
BASOS PCT: 0.7 %
EOS PCT: 3.9 %
Eosinophils Absolute: 230 cells/uL (ref 15–500)
HCT: 44.4 % (ref 38.5–50.0)
HEMOGLOBIN: 14.7 g/dL (ref 13.2–17.1)
Lymphs Abs: 1829 cells/uL (ref 850–3900)
MCH: 27.8 pg (ref 27.0–33.0)
MCHC: 33.1 g/dL (ref 32.0–36.0)
MCV: 84.1 fL (ref 80.0–100.0)
MONOS PCT: 8.5 %
MPV: 12.8 fL — AB (ref 7.5–12.5)
NEUTROS ABS: 3298 {cells}/uL (ref 1500–7800)
Neutrophils Relative %: 55.9 %
Platelets: 179 10*3/uL (ref 140–400)
RBC: 5.28 10*6/uL (ref 4.20–5.80)
RDW: 13.1 % (ref 11.0–15.0)
Total Lymphocyte: 31 %
WBC mixed population: 502 cells/uL (ref 200–950)
WBC: 5.9 10*3/uL (ref 3.8–10.8)

## 2017-11-24 LAB — COMPREHENSIVE METABOLIC PANEL
AG RATIO: 1.8 (calc) (ref 1.0–2.5)
ALT: 13 U/L (ref 9–46)
AST: 16 U/L (ref 10–40)
Albumin: 4.6 g/dL (ref 3.6–5.1)
Alkaline phosphatase (APISO): 77 U/L (ref 40–115)
BUN: 19 mg/dL (ref 7–25)
CHLORIDE: 105 mmol/L (ref 98–110)
CO2: 25 mmol/L (ref 20–32)
CREATININE: 0.97 mg/dL (ref 0.60–1.35)
Calcium: 9.7 mg/dL (ref 8.6–10.3)
GLOBULIN: 2.5 g/dL (ref 1.9–3.7)
Glucose, Bld: 92 mg/dL (ref 65–99)
Potassium: 4.6 mmol/L (ref 3.5–5.3)
SODIUM: 140 mmol/L (ref 135–146)
TOTAL PROTEIN: 7.1 g/dL (ref 6.1–8.1)
Total Bilirubin: 0.8 mg/dL (ref 0.2–1.2)

## 2017-11-24 LAB — LIPID PANEL
Cholesterol: 153 mg/dL (ref <200)
HDL: 43 mg/dL (ref >40)
LDL CHOLESTEROL (CALC): 84 mg/dL
Non-HDL Cholesterol (Calc): 110 mg/dL (calc) (ref <130)
Total CHOL/HDL Ratio: 3.6 (calc) (ref <5.0)
Triglycerides: 164 mg/dL — ABNORMAL HIGH (ref <150)

## 2017-11-24 LAB — TSH: TSH: 1.94 mIU/L (ref 0.40–4.50)

## 2017-11-24 LAB — HEMOGLOBIN A1C
EAG (MMOL/L): 6.3 (calc)
Hgb A1c MFr Bld: 5.6 % of total Hgb (ref <5.7)
MEAN PLASMA GLUCOSE: 114 (calc)

## 2018-07-15 ENCOUNTER — Encounter: Payer: Self-pay | Admitting: Family Medicine

## 2018-07-15 ENCOUNTER — Ambulatory Visit (INDEPENDENT_AMBULATORY_CARE_PROVIDER_SITE_OTHER): Payer: 59 | Admitting: Family Medicine

## 2018-07-15 DIAGNOSIS — B029 Zoster without complications: Secondary | ICD-10-CM

## 2018-07-15 MED ORDER — VALACYCLOVIR HCL 1 G PO TABS: 1000.0000 mg | ORAL_TABLET | Freq: Three times a day (TID) | ORAL | 0 refills | Status: AC

## 2018-07-15 NOTE — Progress Notes (Signed)
Virtual Visit via Video Note  I connected with Johnny Atkins on 07/15/18 at 10:00 AM EDT by a video enabled telemedicine application and verified that I am speaking with the correct person using two identifiers.  Location patient: home Location provider:work or home office Persons participating in the virtual visit: patient, provider  I discussed the limitations of evaluation and management by telemedicine and the availability of in person appointments. The patient expressed understanding and agreed to proceed.  Telemedicine visit is a necessity given the COVID-19 restrictions in place at the current time.  HPI: 48 y/o male being seen today for a couple of complaints:  Skin sensitivity: one week ago he noted onset of left upper arm/shoulder with itching/burning but no rash noted.  Feels some "burning sensation' when he touches the area.  Then got painful papules/pustules on L scapula across L side of upper back yesterday, and now the patches of skin lesions are on back/lateral aspect of L upper arm region.  Very sensitive to touch, bothered him the most when trying to lie on back/shoulder when sleeping last night. No fevers or malaise. No myalgias or arthralgias.  No HA.  No oral lesions or genital lesion.  No face/ear/eye complaints.  Past Medical History:  Diagnosis Date  . Arthritis    Primarily knees, mild osteo  . Gout    2 attacks in right MTP joint around 2005; 2 more flares in foot/toe 2013  . IFG (impaired fasting glucose)    Fasting gluc 101-107  . Low testosterone   . Mild intermittent asthma   . Overweight (BMI 25.0-29.9)   . 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 Right shoulder pain 07/2015   Right AC synovitis (Dr. Barbaraann Barthel)  . Wrist pain    For 2-3 years in his 44s.  Extensive multidisciplinary w/u at [facility] and [facility] revealed NO ABNORMALITY.  Improved with ibuprofen and essentially has spontaneously resolved.    No past surgical history on file.  Family History  Problem  Relation Age of Onset  . Arthritis Mother        osteo (no gout)  . 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   Current Outpatient Medications:  .  colchicine (COLCRYS) 0.6 MG tablet, 2 tabs at onset of gout flare, then 1 tab one hour later. Then 1 tab po bid starting on day #2 of flare and continue until no pain (Patient not taking: Reported on 07/15/2018), Disp: 60 tablet, Rfl: 1  EXAM:  VITALS per patient if applicable: There were no vitals taken for this visit.   GENERAL: alert, oriented, appears well and in no acute distress  SKIN: clusters of erythematous papules/pustules/vesicles scattered over L upper back and extending along dermatomal pattern to area near L axilla and over L deltoid region. The lesions do not cross the midline in the back.    HEENT: atraumatic, conjunttiva clear, no obvious abnormalities on inspection of external nose and ears  NECK: normal movements of the head and neck  LUNGS: on inspection no signs of respiratory distress, breathing rate appears normal, no obvious gross SOB, gasping or wheezing  CV: no obvious cyanosis  MS: moves all visible extremities without noticeable abnormality  PSYCH/NEURO: pleasant and cooperative, no obvious depression or anxiety, speech and thought processing grossly intact  LABS: none today  Lab Results  Component Value Date   TSH 1.94 11/23/2017   Lab Results  Component Value Date   WBC 5.9 11/23/2017   HGB 14.7 11/23/2017   HCT 44.4 11/23/2017   MCV 84.1 11/23/2017   PLT 179 11/23/2017   Lab Results  Component Value Date   CREATININE 0.97 11/23/2017   BUN 19 11/23/2017   NA 140 11/23/2017   K 4.6 11/23/2017   CL 105 11/23/2017   CO2 25 11/23/2017   Lab Results  Component Value Date   ALT 13 11/23/2017   AST 16 11/23/2017   ALKPHOS 64 09/08/2016   BILITOT 0.8 11/23/2017   Lab Results  Component Value Date   CHOL 153 11/23/2017    Lab Results  Component Value Date   HDL 43 11/23/2017   Lab Results  Component Value Date   LDLCALC 84 11/23/2017   Lab Results  Component Value Date   TRIG 164 (H) 11/23/2017   Lab Results  Component Value Date   CHOLHDL 3.6 11/23/2017   Lab Results  Component Value Date   HGBA1C 5.6 11/23/2017   ASSESSMENT AND PLAN:  Discussed the following assessment and plan:  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 Valtrex 1g tid x 7d.  Discussed possible gabapentin use but Johnny Atkins wants to hold off if possible. Ibup 600 mg bid - tid with food recommended. Wound/rash care discussed.  I discussed the assessment and treatment plan with the patient. The patient was provided an opportunity to ask questions and all were answered. The patient agreed with the plan and demonstrated an understanding of the instructions.   The patient was advised to call back or seek an in-person evaluation if the symptoms worsen or if the condition fails to improve as anticipated.  F/u: if not improving as expected.  Signed:  Crissie Sickles, MD           07/15/2018

## 2018-08-16 ENCOUNTER — Ambulatory Visit (INDEPENDENT_AMBULATORY_CARE_PROVIDER_SITE_OTHER): Payer: 59 | Admitting: Physician Assistant

## 2018-08-16 ENCOUNTER — Other Ambulatory Visit: Payer: Self-pay | Admitting: Physician Assistant

## 2018-08-16 ENCOUNTER — Ambulatory Visit (INDEPENDENT_AMBULATORY_CARE_PROVIDER_SITE_OTHER): Payer: 59

## 2018-08-16 ENCOUNTER — Encounter: Payer: Self-pay | Admitting: Physician Assistant

## 2018-08-16 ENCOUNTER — Other Ambulatory Visit: Payer: Self-pay

## 2018-08-16 VITALS — BP 124/80 | HR 57 | Temp 98.7°F | Ht 69.5 in | Wt 182.4 lb

## 2018-08-16 DIAGNOSIS — M79671 Pain in right foot: Secondary | ICD-10-CM | POA: Diagnosis not present

## 2018-08-16 DIAGNOSIS — M79672 Pain in left foot: Secondary | ICD-10-CM | POA: Diagnosis not present

## 2018-08-16 MED ORDER — PREDNISONE 5 MG PO TABS: ORAL_TABLET | ORAL | 0 refills | Status: DC

## 2018-08-16 NOTE — Patient Instructions (Addendum)
Due to the pain that you are having, I do recommend that you immobilize your foot with a boot and keep it in the boot until you see a sports medicine provider (I will put in a referral)  If you do not have a boot -- please let me know.  Start the oral prednisone.  REST!

## 2018-08-16 NOTE — Progress Notes (Signed)
Johnny Atkins is a 48 y.o. male here for a new problem.  I acted as a Education administrator for [employer], PA-C Anselmo Pickler, LPN  History of Present Illness:   Chief Complaint  Patient presents with  . Foot Pain    HPI   Heel pain Pt c/o right heel pain since Wednesday. He is limping unable to bear weight. Taking Ibuprofen 400 mg with little relief. He is runner, runs 5 miles a day on Tuesday with no problem and then woke up with pain. Hx of gout.  Denies numbness/tingling. Runs about 20 miles per week.  Past Medical History:  Diagnosis Date  . Arthritis    Primarily knees, mild osteo  . Gout    2 attacks in right MTP joint around 2005; 2 more flares in foot/toe 2013  . IFG (impaired fasting glucose)    Fasting gluc 101-107  . Low testosterone   . Mild intermittent asthma   . Overweight (BMI 25.0-29.9)   . 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 Right shoulder pain 07/2015   Right AC synovitis (Dr. Barbaraann Barthel)  . Wrist pain    For 2-3 years in his 52s.  Extensive multidisciplinary w/u at [facility] and [facility] revealed NO ABNORMALITY.  Improved with ibuprofen and essentially has spontaneously resolved.     Social History   Socioeconomic History  . Marital status: Married    Spouse name: Not on file  . Number of children: Not on file  . Years of education: Not on file  . Highest education level: Not on file  Occupational History  . Not on file  Social Needs  . Financial resource strain: Not on file  . Food insecurity    Worry: Not on file    Inability: Not on file  . Transportation needs    Medical: Not on file    Non-medical: Not on file  Tobacco Use  . Smoking status: Former Smoker    Types: Pipe, Landscape architect  . Smokeless tobacco: Never Used  Substance and Sexual Activity  . Alcohol use: Yes    Alcohol/week: 0.0 standard drinks  . Drug use: No  . Sexual activity: Not on file  Lifestyle  . Physical activity    Days per week: Not on file    Minutes per session: Not on  file  . Stress: Not on file  Relationships  . Social Herbalist on phone: Not on file    Gets together: Not on file    Attends religious service: Not on file    Active member of club or organization: Not on file    Attends meetings of clubs or organizations: Not on file    Relationship status: Not on file  . Intimate partner violence    Fear of current or ex partner: Not on file    Emotionally abused: Not on file    Physically abused: Not on file    Forced sexual activity: Not on file  Other Topics Concern  . Not on file  Social History Narrative   Married, 2 daughters (62 y/o and 104 y/o).   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   Regular Exercise: 1-2 days per week (elliptical machine 30 min).  He is a vegetarian (tends to eat lots of lentils, bread, rice).     No cigarettes but occasional cigar/pipe.  Two alcohol drinks per week (beer or whisky).   Born in [place], moved  to [place] in 06-05-94.  He is the youngest of 5 siblings.  All siblings healthy but one died in 06-05-1998 of a "stomach problem" rather suddenly.   ?BCG given in [place]?  Positive PPD here in Jun 05, 2003, CXR neg--no treatment.          History reviewed. No pertinent surgical history.  Family History  Problem Relation Age of Onset  . Arthritis Mother        osteo (no gout)  . Arthritis Father        osteo (no gout)    No Known Allergies  Current Medications:   Current Outpatient Medications:  .  ibuprofen (ADVIL) 200 MG tablet, Take 400 mg by mouth every 6 (six) hours as needed., Disp: , Rfl:  .  predniSONE (DELTASONE) 5 MG tablet, 6-5-4-3-2-1-off, Disp: 21 tablet, Rfl: 0   Review of Systems:   ROS  Negative unless otherwise specified per HPI.  Vitals:   Vitals:   08/16/18 1309  BP: 124/80  Pulse: (!) 57  Temp: 98.7 F (37.1 C)  TempSrc: Oral  SpO2: 98%  Weight: 182 lb 6.1 oz (82.7 kg)  Height: 5' 9.5" (1.765 m)     Body mass index is 26.55  kg/m.  Physical Exam:   Physical Exam Vitals signs and nursing note reviewed.  Constitutional:      Appearance: He is well-developed.  HENT:     Head: Normocephalic.  Eyes:     Conjunctiva/sclera: Conjunctivae normal.     Pupils: Pupils are equal, round, and reactive to light.  Neck:     Musculoskeletal: Normal range of motion.  Pulmonary:     Effort: Pulmonary effort is normal.  Musculoskeletal: Normal range of motion.     Comments: Point tender to heel with slight area of erythema and swelling. No tenderness to achilles. No decreased ROM. Unable to bear weight without pain.  Skin:    General: Skin is warm and dry.  Neurological:     Mental Status: He is alert and oriented to person, place, and time.  Psychiatric:        Behavior: Behavior normal.        Thought Content: Thought content normal.        Judgment: Judgment normal.      Assessment and Plan:   Cirilo was seen today for foot pain.  Diagnoses and all orders for this visit:  Pain of right heel -     Cancel: DG Foot Complete Left; Future -     Ambulatory referral to Orthopedics  Other orders -     predniSONE (DELTASONE) 5 MG tablet; 6-5-4-3-2-1-off   Suspect tendinitis, and due to inability to bear weight and overuse (due to running) will have patient immobilize with a boot until he sees Sports Medicine provider. Oral prednisone prescribed.   . Reviewed expectations re: course of current medical issues. . Discussed self-management of symptoms. . Outlined signs and symptoms indicating need for more acute intervention. . Patient verbalized understanding and all questions were answered. . See orders for this visit as documented in the electronic medical record. . Patient received an After-Visit Summary.  CMA or LPN served as scribe during this visit. History, Physical, and Plan performed by medical provider. The above documentation has been reviewed and is accurate and complete.   Inda Coke,  PA-C

## 2018-08-23 ENCOUNTER — Ambulatory Visit: Payer: 59 | Admitting: Family Medicine

## 2018-12-02 ENCOUNTER — Ambulatory Visit: Payer: 59 | Admitting: Family Medicine

## 2018-12-05 ENCOUNTER — Ambulatory Visit: Payer: 59

## 2019-06-19 ENCOUNTER — Encounter: Payer: 59 | Admitting: Family Medicine

## 2019-08-07 ENCOUNTER — Other Ambulatory Visit: Payer: Self-pay

## 2019-08-07 ENCOUNTER — Encounter: Payer: Self-pay | Admitting: Gastroenterology

## 2019-08-07 ENCOUNTER — Ambulatory Visit (INDEPENDENT_AMBULATORY_CARE_PROVIDER_SITE_OTHER): Payer: 59 | Admitting: Family Medicine

## 2019-08-07 ENCOUNTER — Encounter: Payer: Self-pay | Admitting: Family Medicine

## 2019-08-07 VITALS — BP 126/84 | HR 58 | Temp 98.2°F | Resp 16 | Ht 69.5 in | Wt 166.6 lb

## 2019-08-07 DIAGNOSIS — Z23 Encounter for immunization: Secondary | ICD-10-CM | POA: Diagnosis not present

## 2019-08-07 DIAGNOSIS — M109 Gout, unspecified: Secondary | ICD-10-CM | POA: Diagnosis not present

## 2019-08-07 DIAGNOSIS — Z1211 Encounter for screening for malignant neoplasm of colon: Secondary | ICD-10-CM

## 2019-08-07 DIAGNOSIS — Z0000 Encounter for general adult medical examination without abnormal findings: Secondary | ICD-10-CM | POA: Diagnosis not present

## 2019-08-07 DIAGNOSIS — R7301 Impaired fasting glucose: Secondary | ICD-10-CM | POA: Diagnosis not present

## 2019-08-07 DIAGNOSIS — Z125 Encounter for screening for malignant neoplasm of prostate: Secondary | ICD-10-CM | POA: Diagnosis not present

## 2019-08-07 LAB — CBC WITH DIFFERENTIAL/PLATELET
Basophils Absolute: 0 10*3/uL (ref 0.0–0.1)
Basophils Relative: 0.9 % (ref 0.0–3.0)
Eosinophils Absolute: 0.2 10*3/uL (ref 0.0–0.7)
Eosinophils Relative: 4.8 % (ref 0.0–5.0)
HCT: 42.3 % (ref 39.0–52.0)
Hemoglobin: 13.8 g/dL (ref 13.0–17.0)
Lymphocytes Relative: 38.8 % (ref 12.0–46.0)
Lymphs Abs: 1.8 10*3/uL (ref 0.7–4.0)
MCHC: 32.7 g/dL (ref 30.0–36.0)
MCV: 85.2 fl (ref 78.0–100.0)
Monocytes Absolute: 0.4 10*3/uL (ref 0.1–1.0)
Monocytes Relative: 8.2 % (ref 3.0–12.0)
Neutro Abs: 2.2 10*3/uL (ref 1.4–7.7)
Neutrophils Relative %: 47.3 % (ref 43.0–77.0)
Platelets: 152 10*3/uL (ref 150.0–400.0)
RBC: 4.96 Mil/uL (ref 4.22–5.81)
RDW: 14.2 % (ref 11.5–15.5)
WBC: 4.5 10*3/uL (ref 4.0–10.5)

## 2019-08-07 LAB — URIC ACID: Uric Acid, Serum: 7.5 mg/dL (ref 4.0–7.8)

## 2019-08-07 LAB — COMPREHENSIVE METABOLIC PANEL
ALT: 14 U/L (ref 0–53)
AST: 15 U/L (ref 0–37)
Albumin: 4.4 g/dL (ref 3.5–5.2)
Alkaline Phosphatase: 70 U/L (ref 39–117)
BUN: 12 mg/dL (ref 6–23)
CO2: 29 mEq/L (ref 19–32)
Calcium: 9.2 mg/dL (ref 8.4–10.5)
Chloride: 106 mEq/L (ref 96–112)
Creatinine, Ser: 0.97 mg/dL (ref 0.40–1.50)
GFR: 81.92 mL/min (ref >60.00)
Glucose, Bld: 99 mg/dL (ref 70–99)
Potassium: 4.7 mEq/L (ref 3.5–5.1)
Sodium: 139 mEq/L (ref 135–145)
Total Bilirubin: 0.7 mg/dL (ref 0.2–1.2)
Total Protein: 6.8 g/dL (ref 6.0–8.3)

## 2019-08-07 LAB — LIPID PANEL
Cholesterol: 113 mg/dL (ref 0–200)
HDL: 33.7 mg/dL — ABNORMAL LOW (ref >39.00)
LDL Cholesterol: 65 mg/dL (ref 0–99)
NonHDL: 79.47
Total CHOL/HDL Ratio: 3
Triglycerides: 70 mg/dL (ref 0.0–149.0)
VLDL: 14 mg/dL (ref 0.0–40.0)

## 2019-08-07 LAB — PSA: PSA: 0.53 ng/mL (ref 0.10–4.00)

## 2019-08-07 LAB — TSH: TSH: 2.87 u[IU]/mL (ref 0.35–4.50)

## 2019-08-07 MED ORDER — ALLOPURINOL 100 MG PO TABS: 100.0000 mg | ORAL_TABLET | Freq: Every day | ORAL | 6 refills | Status: DC

## 2019-08-07 NOTE — Progress Notes (Signed)
Office Note 08/07/2019  CC:  Chief Complaint  Patient presents with  . Annual Exam    pt is    HPI:  Johnny Atkins is a 49 y.o. male with hx of IFG, gout, and osteoarthritis who is here for annual health maintenance exam.  He just got a flare of gout (MTP joints), mild/mod, colchicine helped.  Most recent attack prior was about 6 mo.  Has purposeful lost wt with frequent running and with signif improved diet. He is a vegetarian but does eat chicken.    Past Medical History:  Diagnosis Date  . Arthritis    Primarily knees, mild osteo  . Gout    2 attacks in right MTP joint around 06-01-03; 2 more flares in foot/toe 2011-06-01  . IFG (impaired fasting glucose)    Fasting gluc 101-107  . Low testosterone   . Mild intermittent asthma   . Overweight (BMI 25.0-29.9)   . 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 Right shoulder pain 07/2015   Right AC synovitis (Dr. Barbaraann Barthel)  . Wrist pain    For 2-3 years in his 50s.  Extensive multidisciplinary w/u at [facility] and [facility] revealed NO ABNORMALITY.  Improved with ibuprofen and essentially has spontaneously resolved.    History reviewed. No pertinent surgical history.  Family History  Problem Relation Age of Onset  . Arthritis Mother        osteo (no gout)  . Arthritis Father        osteo (no gout)    Social History   Socioeconomic History  . Marital status: Married    Spouse name: Not on file  . Number of children: Not on file  . Years of education: Not on file  . Highest education level: Not on file  Occupational History  . Not on file  Tobacco Use  . Smoking status: Former Smoker    Types: Pipe, Landscape architect  . Smokeless tobacco: Never Used  Vaping Use  . Vaping Use: Never used  Substance and Sexual Activity  . Alcohol use: Yes    Alcohol/week: 0.0 standard drinks  . Drug use: No  . Sexual activity: Not on file  Other Topics Concern  . Not on file  Social History Narrative   Married, 2 daughters (90 y/o and 77 y/o).    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   Regular Exercise: 1-2 days per week (elliptical machine 30 min).  He is a vegetarian (tends to eat lots of lentils, bread, rice).     No cigarettes but occasional cigar/pipe.  Two alcohol drinks per week (beer or whisky).   Born in [place], moved to [place] in 06[place].  He is the youngest of 5 siblings.  All siblings healthy but one died in 1998-06-01 of a "stomach problem" rather suddenly.   ?BCG given in [place]?  Positive PPD here in 06/01/2003, CXR neg--no treatment.         Social Determinants of Health   Financial Resource Strain:   . Difficulty of Paying Living Expenses:   Food Insecurity:   . Worried About Charity fundraiser in the Last Year:   . Arboriculturist in the Last Year:   Transportation Needs:   . Film/video editor (Medical):   Marland Kitchen Lack of Transportation (Non-Medical):   Physical Activity:   . Days of Exercise per Week:   . Minutes of Exercise per Session:   Stress:   .  Feeling of Stress :   Social Connections:   . Frequency of Communication with Friends and Family:   . Frequency of Social Gatherings with Friends and Family:   . Attends Religious Services:   . Active Member of Clubs or Organizations:   . Attends Archivist Meetings:   Marland Kitchen Marital Status:   Intimate Partner Violence:   . Fear of Current or Ex-Partner:   . Emotionally Abused:   Marland Kitchen Physically Abused:   . Sexually Abused:     Outpatient Medications Prior to Visit  Medication Sig Dispense Refill  . colchicine 0.6 MG tablet Take 0.6 mg by mouth daily.    Marland Kitchen ibuprofen (ADVIL) 200 MG tablet Take 400 mg by mouth every 6 (six) hours as needed.    . predniSONE (DELTASONE) 5 MG tablet 6-5-4-3-2-1-off (Patient not taking: Reported on 08/07/2019) 21 tablet 0   No facility-administered medications prior to visit.    No Known Allergies  ROS Review of Systems  Constitutional: Negative for appetite change, chills, fatigue and  fever.  HENT: Negative for congestion, dental problem, ear pain and sore throat.   Eyes: Negative for discharge, redness and visual disturbance.  Respiratory: Negative for cough, chest tightness, shortness of breath and wheezing.   Cardiovascular: Negative for chest pain, palpitations and leg swelling.  Gastrointestinal: Negative for abdominal pain, blood in stool, diarrhea, nausea and vomiting.  Genitourinary: Negative for difficulty urinating, dysuria, flank pain, frequency, hematuria and urgency.  Musculoskeletal: Negative for arthralgias, back pain, joint swelling, myalgias and neck stiffness.  Skin: Negative for pallor and rash.  Neurological: Negative for dizziness, speech difficulty, weakness and headaches.  Hematological: Negative for adenopathy. Does not bruise/bleed easily.  Psychiatric/Behavioral: Negative for confusion and sleep disturbance. The patient is not nervous/anxious.     PE; Vitals with BMI 08/07/2019 08/16/2018 11/23/2017  Height 5' 9.5" 5' 9.5" 5' 9.5"  Weight 166 lbs 10 oz 182 lbs 6 oz 176 lbs 6 oz  BMI 24.26 36.64 40.34  Systolic 742 595 638  Diastolic 84 80 54  Pulse 58 57 61    Gen: Alert, well appearing.  Patient is oriented to person, place, time, and situation. AFFECT: pleasant, lucid thought and speech. ENT: Ears: EACs clear, normal epithelium.  TMs with good light reflex and landmarks bilaterally.  Eyes: no injection, icteris, swelling, or exudate.  EOMI, PERRLA. Nose: no drainage or turbinate edema/swelling.  No injection or focal lesion.  Mouth: lips without lesion/swelling.  Oral mucosa pink and moist.  Dentition intact and without obvious caries or gingival swelling.  Oropharynx without erythema, exudate, or swelling.  Neck: supple/nontender.  No LAD, mass, or TM.  Carotid pulses 2+ bilaterally, without bruits. CV: RRR, no m/r/g.   LUNGS: CTA bilat, nonlabored resps, good aeration in all lung fields. ABD: soft, NT, ND, BS normal.  No hepatospenomegaly  or mass.  No bruits. EXT: no clubbing, cyanosis, or edema.  Musculoskeletal: no joint swelling, erythema, warmth, or tenderness.  ROM of all joints intact. Skin - no sores or suspicious lesions or rashes or color changes Rectal exam: negative without mass, lesions or tenderness, PROSTATE EXAM: smooth and symmetric without nodules or tenderness.   Pertinent labs:  Lab Results  Component Value Date   TSH 1.94 11/23/2017   Lab Results  Component Value Date   WBC 5.9 11/23/2017   HGB 14.7 11/23/2017   HCT 44.4 11/23/2017   MCV 84.1 11/23/2017   PLT 179 11/23/2017   Lab Results  Component Value Date  CREATININE 0.97 11/23/2017   BUN 19 11/23/2017   NA 140 11/23/2017   K 4.6 11/23/2017   CL 105 11/23/2017   CO2 25 11/23/2017   Lab Results  Component Value Date   ALT 13 11/23/2017   AST 16 11/23/2017   ALKPHOS 64 09/08/2016   BILITOT 0.8 11/23/2017   Lab Results  Component Value Date   CHOL 153 11/23/2017   Lab Results  Component Value Date   HDL 43 11/23/2017   Lab Results  Component Value Date   LDLCALC 84 11/23/2017   Lab Results  Component Value Date   TRIG 164 (H) 11/23/2017   Lab Results  Component Value Date   CHOLHDL 3.6 11/23/2017   Lab Results  Component Value Date   HGBA1C 5.6 11/23/2017    ASSESSMENT AND PLAN:   1) Gouty arthritis, 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 Pt in favor of starting allopurinol. Check uric acid today. Start allopurinol 100 mg qd today. Recheck uric acid level 2 wks.  2) Health maintenance exam: Reviewed age and gender appropriate health maintenance issues (prudent diet, regular exercise, health risks of tobacco and excessive alcohol, use of seatbelts, fire alarms in home, use of sunscreen).  Also reviewed age and gender appropriate health screening as well as vaccine recommendations. Vaccines: Tdap UTD.  He has had covid 19 vaccine.  Shingrix discussed->#1 given today (he had L upper back and L shoulder shingles  last year). Labs: fasting HP, HbA1c (IFG), uric acid (gout), and PSA. Prostate ca screening: due for initial screening->DRE , PSA. Colon ca screening: due for initial screening colonoscopy->referred to GI today.  An After Visit Summary was printed and given to the patient.  FOLLOW UP:  Return in about 1 year (around 08/06/2020) for annual CPE (fasting): also nonfasting lab visit for uric acid level.  Signed:  Crissie Sickles, MD           08/07/2019

## 2019-08-07 NOTE — Patient Instructions (Signed)

## 2019-08-08 LAB — HEMOGLOBIN A1C: Hgb A1c MFr Bld: 5.4 % (ref 4.6–6.5)

## 2019-08-10 ENCOUNTER — Encounter: Payer: Self-pay | Admitting: Family Medicine

## 2019-08-29 ENCOUNTER — Ambulatory Visit (INDEPENDENT_AMBULATORY_CARE_PROVIDER_SITE_OTHER): Payer: 59 | Admitting: Family Medicine

## 2019-08-29 ENCOUNTER — Other Ambulatory Visit: Payer: Self-pay

## 2019-08-29 DIAGNOSIS — M109 Gout, unspecified: Secondary | ICD-10-CM

## 2019-08-29 LAB — URIC ACID: Uric Acid, Serum: 7.4 mg/dL (ref 4.0–7.8)

## 2019-09-02 ENCOUNTER — Other Ambulatory Visit: Payer: Self-pay | Admitting: Family Medicine

## 2019-09-02 ENCOUNTER — Other Ambulatory Visit: Payer: Self-pay

## 2019-09-02 DIAGNOSIS — M109 Gout, unspecified: Secondary | ICD-10-CM

## 2019-09-02 MED ORDER — ALLOPURINOL 300 MG PO TABS
300.0000 mg | ORAL_TABLET | Freq: Every day | ORAL | 0 refills | Status: DC
Start: 2019-09-02 — End: 2019-09-29

## 2019-09-02 MED ORDER — PREDNISONE 10 MG PO TABS
ORAL_TABLET | ORAL | 0 refills | Status: DC
Start: 2019-09-02 — End: 2019-09-19

## 2019-09-19 ENCOUNTER — Other Ambulatory Visit: Payer: Self-pay

## 2019-09-19 ENCOUNTER — Ambulatory Visit (AMBULATORY_SURGERY_CENTER): Payer: Self-pay

## 2019-09-19 VITALS — Ht 69.5 in | Wt 170.0 lb

## 2019-09-19 DIAGNOSIS — Z1211 Encounter for screening for malignant neoplasm of colon: Secondary | ICD-10-CM

## 2019-09-19 MED ORDER — SUTAB 1479-225-188 MG PO TABS: 1.0000 | ORAL_TABLET | ORAL | 0 refills | Status: DC

## 2019-09-19 NOTE — Progress Notes (Signed)
Pre visit appt conducted via phone- patient verified identity and address;  No egg or soy allergy known to patient  No issues with past sedation with any surgeries or procedures- no previous surgery No intubation problems in the past  No diet pills per patient No home 02 use per patient  No blood thinners per patient  Pt denies issues with constipation  No A fib or A flutter  EMMI video to [place] 19 guidelines implemented in PV today   Coupon given to pt in [facility] today  Prep instructions sent to patient via [place] and also printed/mailed per patient request; Due to the COVID-19 pandemic we are asking patients to follow these guidelines. Please only bring one care partner. Please be aware that your care partner may wait in the car in the parking lot or if they feel like they will be too hot to wait in the car, they may wait in the lobby on the 4th floor. All care partners are required to wear a mask the entire time (we do not have any that we can provide them), they need to practice social distancing, and we will do a Covid check for all patient's and care partners when you arrive. Also we will check their temperature and your temperature. If the care partner waits in their car they need to stay in the parking lot the entire time and we will call them on their cell phone when the patient is ready for discharge so they can bring the car to the front of the building. Also all patient's will need to wear a mask into building.

## 2019-09-25 ENCOUNTER — Encounter: Payer: Self-pay | Admitting: Gastroenterology

## 2019-09-26 ENCOUNTER — Ambulatory Visit (INDEPENDENT_AMBULATORY_CARE_PROVIDER_SITE_OTHER): Payer: 59 | Admitting: Family Medicine

## 2019-09-26 ENCOUNTER — Other Ambulatory Visit: Payer: Self-pay

## 2019-09-26 ENCOUNTER — Other Ambulatory Visit: Payer: Self-pay | Admitting: Family Medicine

## 2019-09-26 DIAGNOSIS — M109 Gout, unspecified: Secondary | ICD-10-CM | POA: Diagnosis not present

## 2019-09-26 LAB — URIC ACID: Uric Acid, Serum: 6.6 mg/dL (ref 4.0–7.8)

## 2019-09-28 ENCOUNTER — Other Ambulatory Visit: Payer: Self-pay | Admitting: Family Medicine

## 2019-09-29 ENCOUNTER — Other Ambulatory Visit: Payer: Self-pay

## 2019-09-29 MED ORDER — ALLOPURINOL 300 MG PO TABS: ORAL_TABLET | ORAL | 3 refills | Status: DC

## 2019-10-03 ENCOUNTER — Encounter: Payer: 59 | Admitting: Gastroenterology

## 2019-10-07 ENCOUNTER — Telehealth: Payer: Self-pay

## 2019-10-07 ENCOUNTER — Telehealth: Payer: Self-pay | Admitting: Family Medicine

## 2019-10-07 NOTE — Telephone Encounter (Signed)
Patient is calling asking if his prescription for allopurinol (ZYLOPRIM) 300 MG tablet is ready for pick up here at the office. Also wanted to ask instead of doing the colonoscopy if he can do a cologuard test

## 2019-10-07 NOTE — Telephone Encounter (Signed)
Patient called to inquire about doing cologuard instead of colonoscopy. Referral was completed on 09/19/19 for Dr.Armbruster and appt currently scheduled for 10/20/19. Please advise if okay for switch?

## 2019-10-07 NOTE — Telephone Encounter (Signed)
Spoke with patient and advised when we last spoke about lab results, he requested written prescription. He stated he would have his wife come and pick up. Separate telephone message sent to PCP regarding cologuard test. Wife came by and picked up Rx.

## 2019-10-08 ENCOUNTER — Other Ambulatory Visit: Payer: Self-pay

## 2019-10-08 ENCOUNTER — Encounter: Payer: Self-pay | Admitting: Family Medicine

## 2019-10-08 DIAGNOSIS — Z1211 Encounter for screening for malignant neoplasm of colon: Secondary | ICD-10-CM

## 2019-10-08 NOTE — Telephone Encounter (Signed)
Yes cologuard is fine

## 2019-10-08 NOTE — Telephone Encounter (Signed)
Spoke with patient and informed ok for switch to cologuard. He would need to call Dr.Armbruster's office and cancel colonoscopy appt. Order entered in [place]. Will fax order

## 2019-10-20 ENCOUNTER — Encounter: Payer: 59 | Admitting: Gastroenterology

## 2019-10-24 ENCOUNTER — Ambulatory Visit: Payer: 59

## 2019-10-30 ENCOUNTER — Ambulatory Visit (INDEPENDENT_AMBULATORY_CARE_PROVIDER_SITE_OTHER): Payer: 59 | Admitting: Family Medicine

## 2019-10-30 ENCOUNTER — Other Ambulatory Visit: Payer: Self-pay

## 2019-10-30 DIAGNOSIS — M109 Gout, unspecified: Secondary | ICD-10-CM

## 2019-10-30 LAB — URIC ACID: Uric Acid, Serum: 4.6 mg/dL (ref 4.0–7.8)

## 2019-10-31 ENCOUNTER — Encounter: Payer: Self-pay | Admitting: Family Medicine

## 2019-10-31 LAB — COLOGUARD
COLOGUARD: NEGATIVE
Cologuard: NEGATIVE

## 2019-11-17 IMAGING — DX RIGHT FOOT COMPLETE - 3+ VIEW
3 series · 3 of 3 positions shown · non-contrast
Comparison: None.

CLINICAL DATA: Left heel pain for 3 days.  No known injury.

EXAM:
RIGHT FOOT COMPLETE - 3+ VIEW

[foot dp]
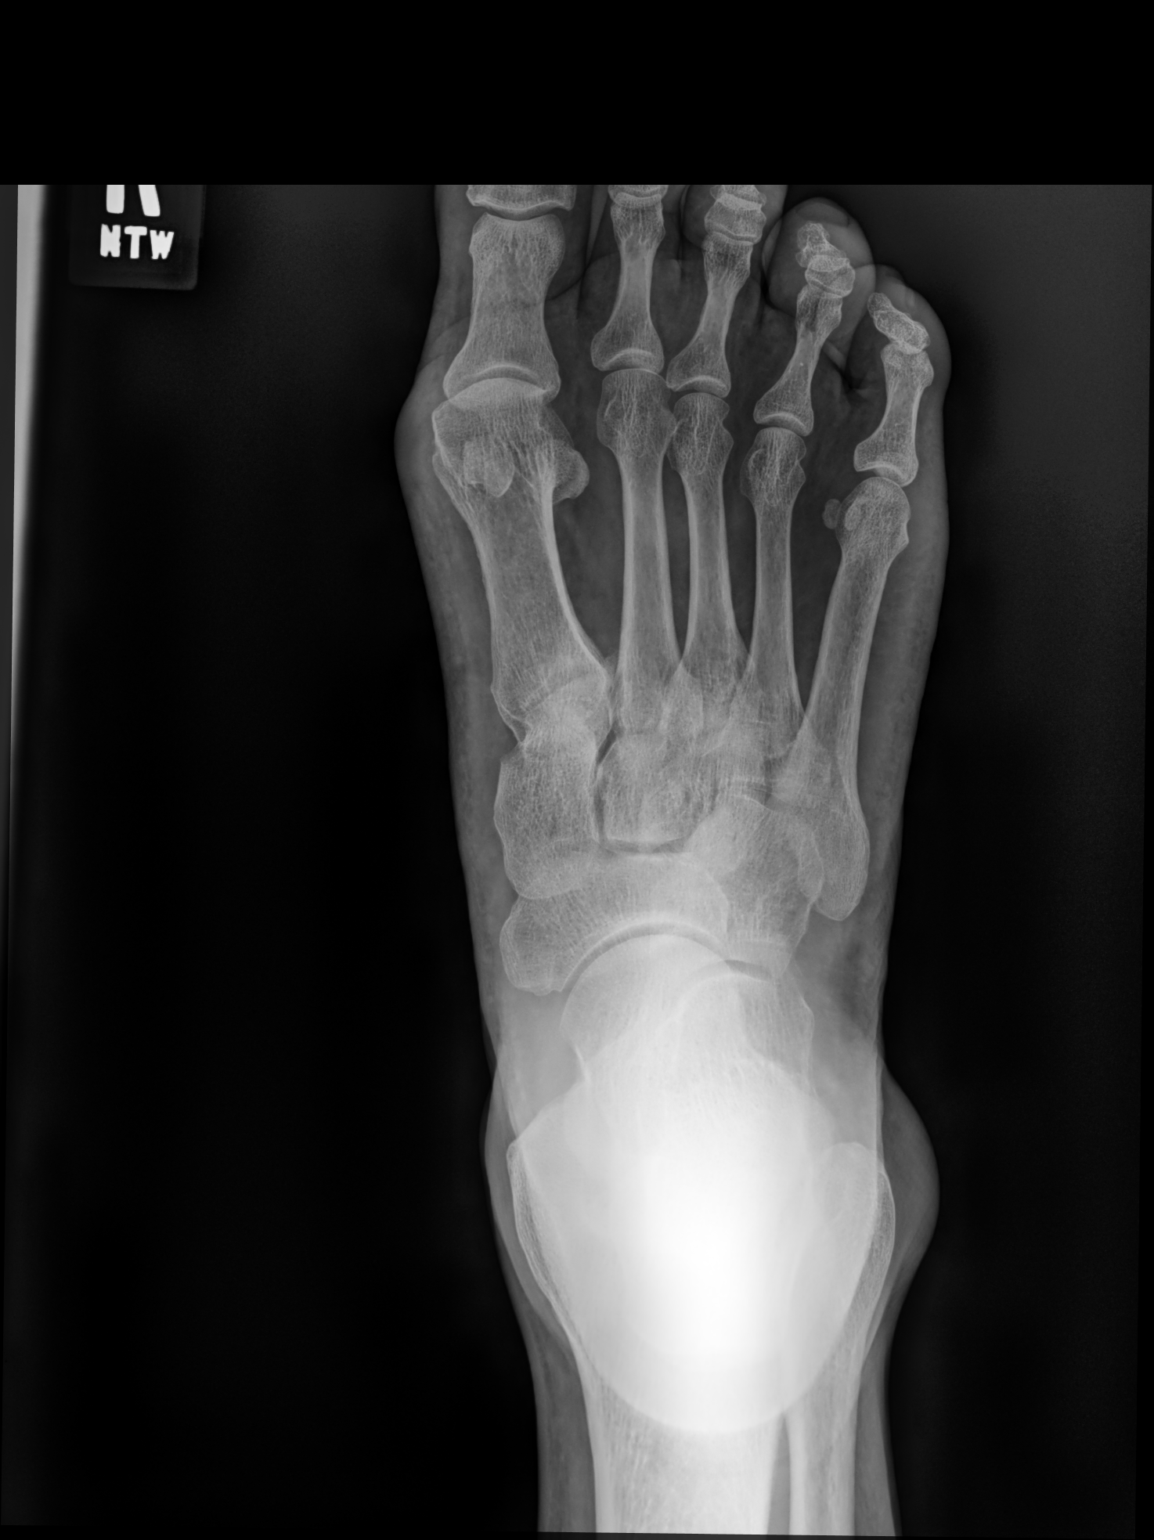

[foot oblique]
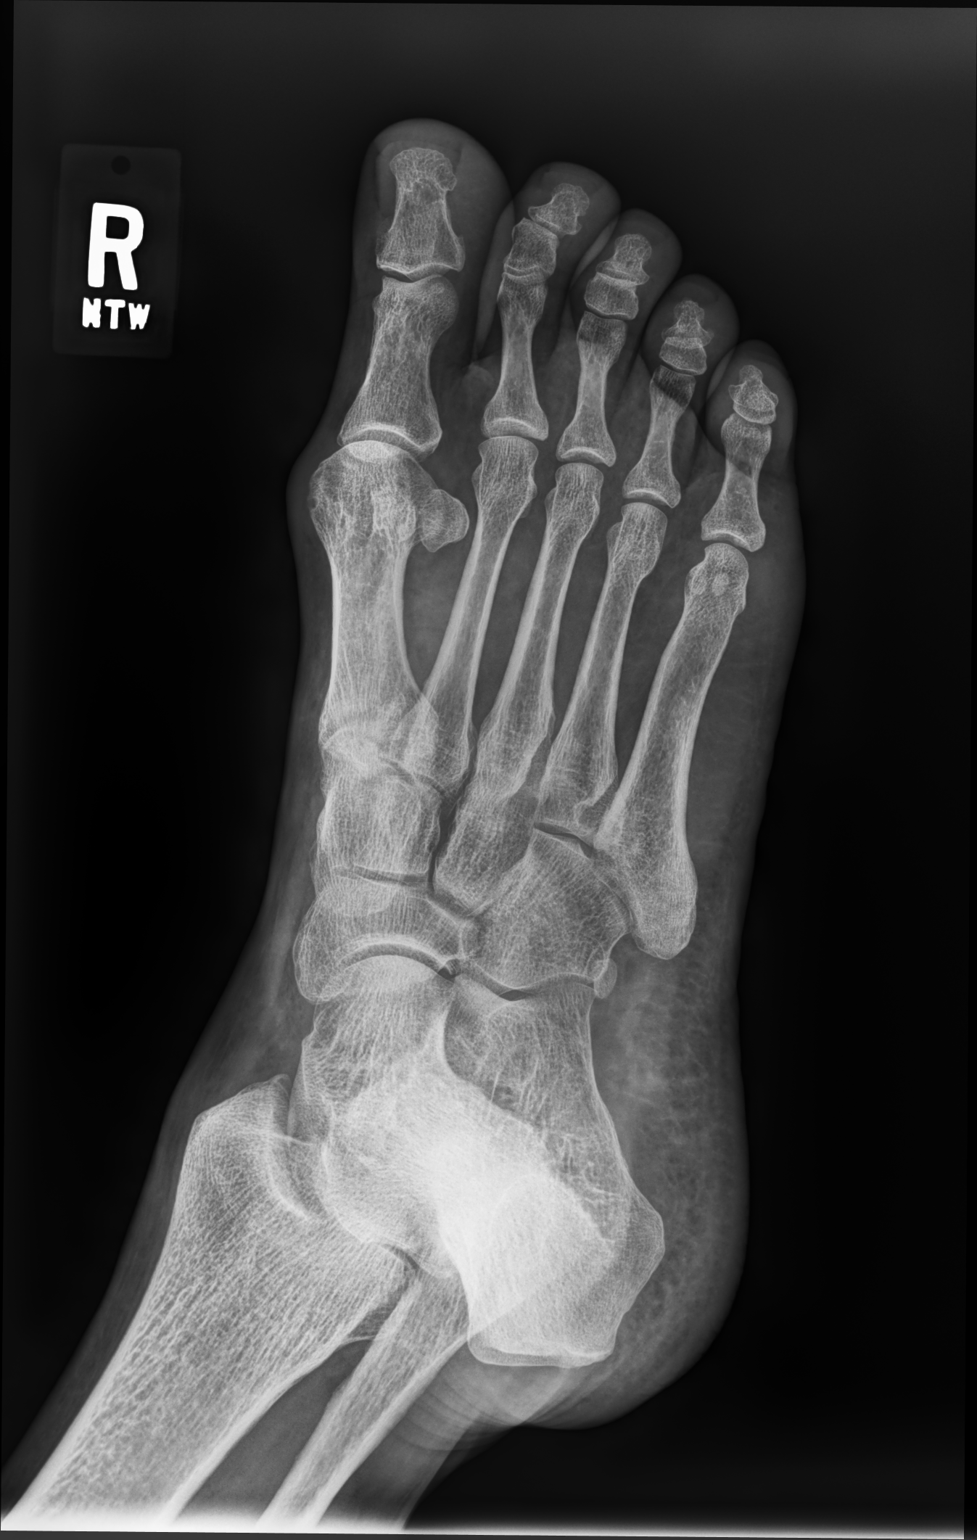

[foot lat]
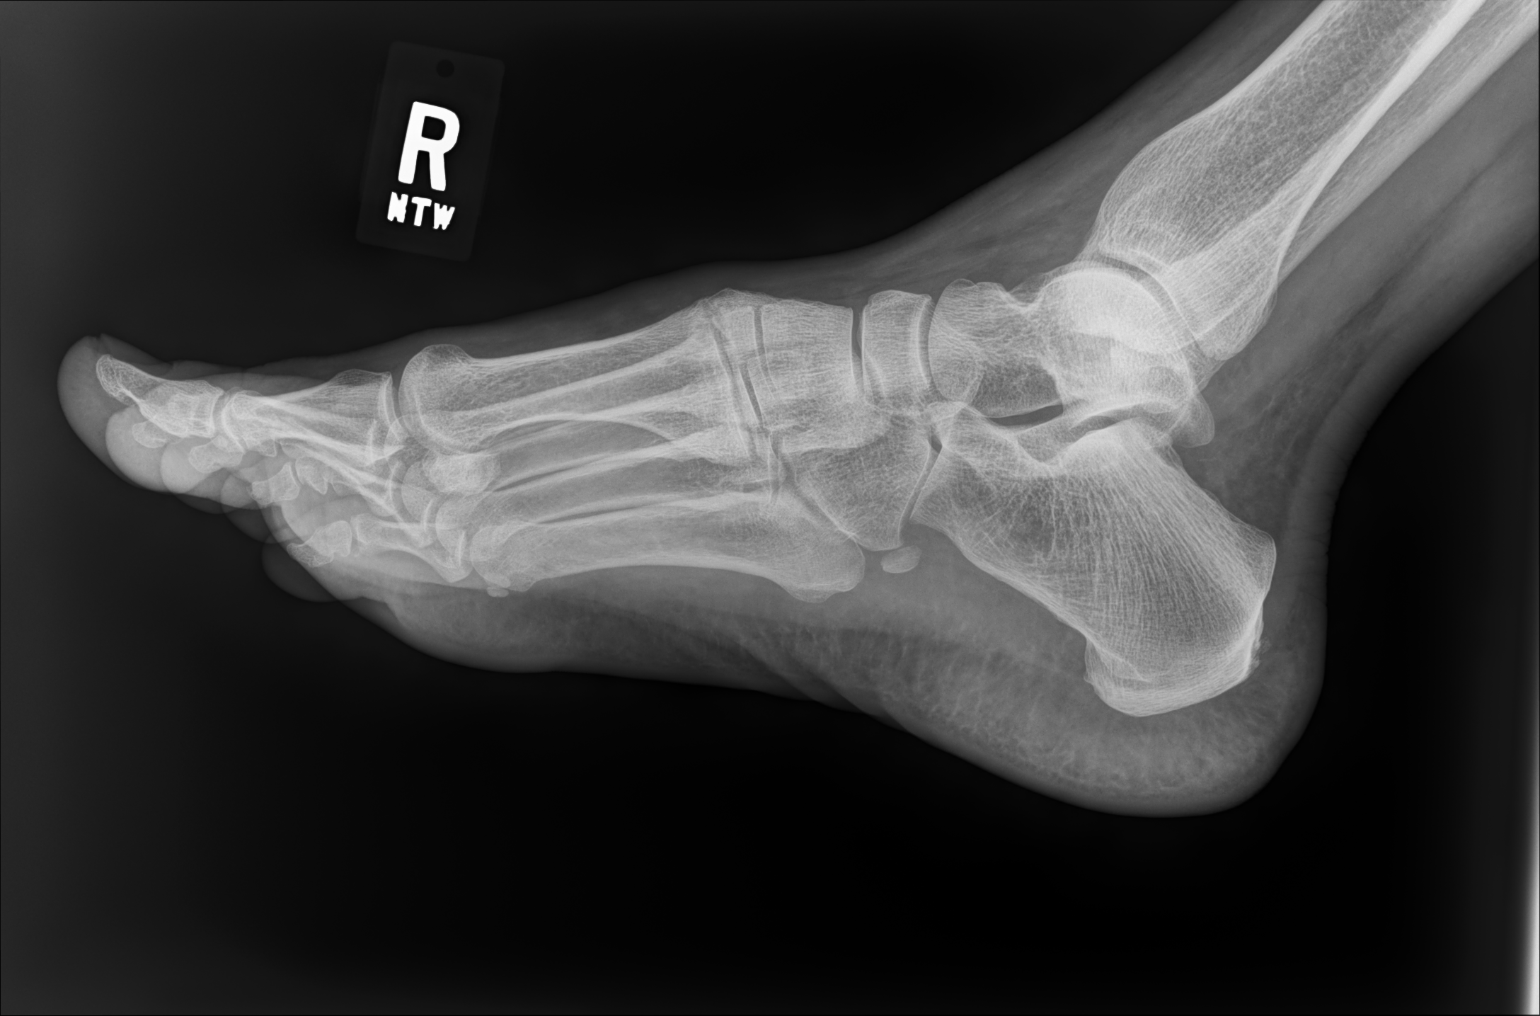

[3 of 3 positions shown; findings below may reference images not displayed]

FINDINGS: There is no evidence of fracture or dislocation. There is no
evidence of arthropathy or other focal bone abnormality. Soft
tissues are unremarkable.
IMPRESSION: Negative exam.

## 2020-02-02 ENCOUNTER — Other Ambulatory Visit: Payer: Self-pay | Admitting: Family Medicine

## 2020-06-25 ENCOUNTER — Other Ambulatory Visit: Payer: Self-pay

## 2020-06-25 ENCOUNTER — Ambulatory Visit: Payer: 59 | Admitting: Family Medicine

## 2020-06-25 ENCOUNTER — Encounter: Payer: Self-pay | Admitting: Family Medicine

## 2020-06-25 VITALS — BP 118/78 | HR 68 | Temp 97.3°F | Ht 69.5 in | Wt 182.8 lb

## 2020-06-25 DIAGNOSIS — K121 Other forms of stomatitis: Secondary | ICD-10-CM | POA: Diagnosis not present

## 2020-06-25 DIAGNOSIS — D171 Benign lipomatous neoplasm of skin and subcutaneous tissue of trunk: Secondary | ICD-10-CM

## 2020-06-25 DIAGNOSIS — R1030 Lower abdominal pain, unspecified: Secondary | ICD-10-CM | POA: Diagnosis not present

## 2020-06-25 LAB — POCT URINALYSIS DIPSTICK
Bilirubin, UA: NEGATIVE
Blood, UA: NEGATIVE
Glucose, UA: NEGATIVE
Ketones, UA: NEGATIVE
Leukocytes, UA: NEGATIVE
Nitrite, UA: NEGATIVE
Protein, UA: NEGATIVE
Spec Grav, UA: 1.02 (ref 1.010–1.025)
Urobilinogen, UA: NEGATIVE E.U./dL — AB
pH, UA: 6 (ref 5.0–8.0)

## 2020-06-25 NOTE — Progress Notes (Signed)
[facility] [address] [place] [address] Dept: [phone] Dept Fax: [phone]  Office Visit  Subjective:    Patient ID: Johnny Atkins, male    DOB: May 13, 1970, 50 y.o..   MRN: [id number]  Chief Complaint  Patient presents with  . Acute Visit    C/o having abdominal pain off/on x 1 week.  He hasn't been taking any meds for pain.  Feels pain after eating.     History of Present Illness:  Patient is in today for with a 4 day history of lower abdominal discomfort. He has not had any nausea, vomiting, or diarrhea. He denies fever. He has had some mild constipation. He denies any dysuria, but feels the urine may have been a bit darker than usual. M.r Pangborn was away in [place] last week and admits his diet was quite different than usual. He has not found any particular things that make the pain worse other than pressing on the lower abdomen. He has not taken any medication for this. He does admit to some mild right lower back discomfort.  Additionally, he asks a lump on his lower chest that has been present for several years. This is not tender.  As well, he notes a recent mouth ulcer. He states this is getting better, but has ocnitnued ot be uncomfortable at times.  Past Medical History: Patient Active Problem List   Diagnosis Date Noted  . Impaired fasting glucose 09/11/2016  . Right shoulder pain 08/09/2015  . Bereavement reaction 06/04/2012  . Asthmatic bronchitis 05/08/2012  . Health maintenance examination 01/06/2011  . GOUT 11/29/2006   No past surgical history on file.  Family History  Problem Relation Age of Onset  . Arthritis Mother        osteo (no gout)  . Arthritis Father        osteo (no gout)  . Colon polyps Neg Hx   . Colon cancer Neg Hx   . Esophageal cancer Neg Hx   . Rectal cancer Neg Hx   . Stomach cancer Neg Hx    Outpatient Medications Prior to Visit  Medication Sig Dispense Refill  .  allopurinol (ZYLOPRIM) 300 MG tablet TAKE 1 AND 1/2 TABLETS BY MOUTH EVERY DAY 135 tablet 1  . colchicine 0.6 MG tablet Take 0.6 mg by mouth daily.    Marland Kitchen ibuprofen (ADVIL) 200 MG tablet Take 400 mg by mouth every 6 (six) hours as needed.    . Sodium Sulfate-Mag Sulfate-KCl (SUTAB) [phone] MG TABS Take 1 kit by mouth as directed. (Patient not taking: Reported on 06/25/2020) 24 tablet 0   No facility-administered medications prior to visit.   No Known Allergies    Objective:   Today's Vitals   06/25/20 1437  BP: 118/78  Pulse: 68  Temp: (!) 97.3 F (36.3 C)  TempSrc: Temporal  SpO2: 96%  Weight: 182 lb 12.8 oz (82.9 kg)  Height: 5' 9.5" (1.765 m)   Body mass index is 26.61 kg/m.   General: Well developed, well nourished. No acute distress. Mouth: There is an ulcer at the reflex between the buccal mucosa and the gum at the right upper molar area. Abdomen: Soft. Bowel sounds positive, normal pitch and frequency. No hepatosplenomegaly. Mild tenderness across the lower  abdomen, but no rebound or guarding. There are no papable masses. There are areas in either upper flanks that feel like   dense scar tissue in the abdominal wall, but this is non tender.  Back: Straight. No CVA tenderness bilaterally. Skin: There is a 3-4 cm smooth, mobile subcutaneous mass on the anterior chest wall over the left parasternal area. Psych: Alert and oriented. Normal mood and affect.  Health Maintenance Due  Topic Date Due  . COLONOSCOPY (Pts 45-74yr Insurance coverage will need to be confirmed)  Never done  . COVID-19 Vaccine (3 - Booster) 12/25/2019    Lab Results Urinalysis    Component Value Date/Time   BILIRUBINUR neg 06/25/2020 1518   PROTEINUR Negative 06/25/2020 1518   UROBILINOGEN negative (A) 06/25/2020 1518   NITRITE neg 06/25/2020 1518   LEUKOCYTESUR Negative 06/25/2020 1518     Assessment & Plan:   1. Lower abdominal pain The abdominal pain is nonspecific but does not appear  to represent an acute abdomen. I suspect this may be due to the mild constipation he has noted. I recommended he maintain hydration and use Colace 1-2 times a day until the constipation resolves. If not improved within a week, he should follow-up with his PCP, Dr. MAnitra Lauth  - POCT Urinalysis Dipstick  2. Mouth ulcer We discussed him doing warm salt water swishes after meals. an using Anbesol as needed for discomfort.  3. Lipoma of torso We discussed the benign nature of this lesion. I did share with him that this could be removed in a simple office procedure.   SHaydee Salter MD

## 2020-06-25 NOTE — Patient Instructions (Addendum)
Try using Colace (docusate sodium) 1-2 times a day until constopation relieved.  Use Anbesol for mouth ulcer pain relief. Swish and spit warm salt water after meals until healed.

## 2020-06-30 ENCOUNTER — Telehealth: Payer: Self-pay

## 2020-06-30 NOTE — Telephone Encounter (Signed)
Pt is requesting to speak with PCP or PCP nurse about upcoming appt. Wants to explain what is going on so PCP can tell him if he needs to be seen here or with a different Dr/specialist. Advised pt that he would still need to be seen in office but pt insist on speaking with PCP or PCP nurse.

## 2020-06-30 NOTE — Telephone Encounter (Signed)
Spoke with pt to advise keeping appt already scheduled.

## 2020-06-30 NOTE — Telephone Encounter (Signed)
Spoke with pt regarding stomach issues. He was seen by Dr.Rudd on 04/29 and "I recommended he maintain hydration and use Colace 1-2 times a day until the constipation resolves. If not improved within a week, he should follow-up with his PCP, Dr. Anitra Lauth".  Please advise if he needs to keep appt for Friday 05/06

## 2020-06-30 NOTE — Telephone Encounter (Signed)
Keep appt with me

## 2020-07-01 NOTE — Progress Notes (Signed)
OFFICE VISIT  07/02/2020  CC:  Chief Complaint  Patient presents with  . Follow-up    Lower abdominal pain, last saw Dr.Rudd on 4/29 regarding this    HPI:    Patient is a 50 y.o. male who presents for "stomach issues". I reviewed note by Dr. Gena Fray from 06/25/20.  Lower abd discomfort felt to be most compatible with constipation--instructed to use colace and hydrate well.  A UA was normal.  No blood labs or imaging done. Was told to f/u with me if not signif improved in 4-5d.  HPI: Mild diffuse lower abd discomfort onset about 2 wks ago.  Relatively constant. Notes it more when he is not distracted by work.  No otc med tried.  The colace he took at recommendation of Dr. Gena Fray has not changed anything. No n/v/d.  Appetite normal. Normal, regular, formed bm's w/out blood or pus. The week prior he had been on vacation and ate diet signif diff than his usual.  He got screening cologuard 08/2019 and it was normal (via GI MD).  Past Medical History:  Diagnosis Date  . Allergy    seasonal  . Arthritis    Primarily knees, mild osteo  . Colon cancer screening    pt chose cologuard 09/2019  . Gout    2 attacks in right MTP joint around 2005; 2 more flares in foot/toe 2013.  Started allopurinol 07/2019  . IFG (impaired fasting glucose)    Fasting gluc 101-107  . Low testosterone   . Mild intermittent asthma   . Overweight (BMI 25.0-29.9)   . 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 Right shoulder pain 07/2015   Right AC synovitis (Dr. Barbaraann Barthel)  . Wrist pain    For 2-3 years in his 55s.  Extensive multidisciplinary w/u at [facility] and [facility] revealed NO ABNORMALITY.  Improved with ibuprofen and essentially has spontaneously resolved.    History reviewed. No pertinent surgical history.  Outpatient Medications Prior to Visit  Medication Sig Dispense Refill  . allopurinol (ZYLOPRIM) 300 MG tablet TAKE 1 AND 1/2 TABLETS BY MOUTH EVERY DAY 135 tablet 1  . colchicine 0.6 MG tablet Take 0.6 mg by  mouth daily. (Patient not taking: Reported on 07/02/2020)    . ibuprofen (ADVIL) 200 MG tablet Take 400 mg by mouth every 6 (six) hours as needed. (Patient not taking: Reported on 07/02/2020)     No facility-administered medications prior to visit.    No Known Allergies  ROS As per HPI  PE: Vitals with BMI 07/02/2020 06/25/2020 09/19/2019  Height 5' 9.5" 5' 9.5" 5' 9.5"  Weight 178 lbs 182 lbs 13 oz 170 lbs  BMI 25.92 09.73 53.29  Systolic 924 268 -  Diastolic 72 78 -  Pulse 93 68 -    Gen: Alert, well appearing.  Patient is oriented to person, place, time, and situation. AFFECT: pleasant, lucid thought and speech. TMH:DQQI: no injection, icteris, swelling, or exudate.  EOMI, PERRLA. Mouth: lips without lesion/swelling.  Oral mucosa pink and moist. Oropharynx without erythema, exudate, or swelling.  CV: RRR, no m/r/g.   LUNGS: CTA bilat, nonlabored resps, good aeration in all lung fields. ABD: soft, mildly TTP diffusely in lower abdomen, w/out guarding or rebound, ND, BS normal.  No hepatospenomegaly or mass.  No bruits.  His tenderness is unchanged when I palpate lower abd while he does abdominal crunch. EXT: no clubbing or cyanosis.  no edema.    LABS:  Lab Results  Component Value Date  TSH 2.87 08/07/2019   Lab Results  Component Value Date   WBC 4.5 08/07/2019   HGB 13.8 08/07/2019   HCT 42.3 08/07/2019   MCV 85.2 08/07/2019   PLT 152.0 08/07/2019   Lab Results  Component Value Date   CREATININE 0.97 08/07/2019   BUN 12 08/07/2019   NA 139 08/07/2019   K 4.7 08/07/2019   CL 106 08/07/2019   CO2 29 08/07/2019   Lab Results  Component Value Date   ALT 14 08/07/2019   AST 15 08/07/2019   ALKPHOS 70 08/07/2019   BILITOT 0.7 08/07/2019   Lab Results  Component Value Date   CHOL 113 08/07/2019   Lab Results  Component Value Date   HDL 33.70 (L) 08/07/2019   Lab Results  Component Value Date   LDLCALC 65 08/07/2019   Lab Results  Component Value Date    TRIG 70.0 08/07/2019   Lab Results  Component Value Date   CHOLHDL 3 08/07/2019   Lab Results  Component Value Date   PSA 0.53 08/07/2019   Lab Results  Component Value Date   HGBA1C 5.4 08/07/2019   IMPRESSION AND PLAN:  1) Nonspecific lower abd pain, subacute, unclear etiology. Mild diverticulitis vs abd wall pain most likely dx at this point in time.  No red flags for IBD, biliary dz, or upper GI dz.  He denies constipation and has no urinary sx's.  No clear indication for abd imaging at this time. Plan is check cbc, cmet, esr, crp and do empiric abx for diverticulitis-->cipro 500 mg bid x 14d and flagyl 500 mg tid x 14d. If not improved at f/u in 2 wks then will ask GI to see him.  An After Visit Summary was printed and given to the patient.  FOLLOW UP: Return in about 2 weeks (around 07/16/2020) for f/u lower abd pain.  Signed:  Crissie Sickles, MD           07/02/2020

## 2020-07-02 ENCOUNTER — Ambulatory Visit: Payer: 59 | Admitting: Family Medicine

## 2020-07-02 ENCOUNTER — Encounter: Payer: Self-pay | Admitting: Family Medicine

## 2020-07-02 ENCOUNTER — Other Ambulatory Visit: Payer: Self-pay

## 2020-07-02 VITALS — BP 110/72 | HR 93 | Temp 97.6°F | Resp 16 | Ht 69.5 in | Wt 178.0 lb

## 2020-07-02 DIAGNOSIS — R1030 Lower abdominal pain, unspecified: Secondary | ICD-10-CM

## 2020-07-02 DIAGNOSIS — K5792 Diverticulitis of intestine, part unspecified, without perforation or abscess without bleeding: Secondary | ICD-10-CM

## 2020-07-02 LAB — COMPREHENSIVE METABOLIC PANEL
ALT: 14 U/L (ref 0–53)
AST: 16 U/L (ref 0–37)
Albumin: 4.6 g/dL (ref 3.5–5.2)
Alkaline Phosphatase: 70 U/L (ref 39–117)
BUN: 21 mg/dL (ref 6–23)
CO2: 26 mEq/L (ref 19–32)
Calcium: 9.6 mg/dL (ref 8.4–10.5)
Chloride: 105 mEq/L (ref 96–112)
Creatinine, Ser: 1.12 mg/dL (ref 0.40–1.50)
GFR: 76.38 mL/min (ref >60.00)
Glucose, Bld: 90 mg/dL (ref 70–99)
Potassium: 4.5 mEq/L (ref 3.5–5.1)
Sodium: 141 mEq/L (ref 135–145)
Total Bilirubin: 1.1 mg/dL (ref 0.2–1.2)
Total Protein: 6.9 g/dL (ref 6.0–8.3)

## 2020-07-02 LAB — CBC WITH DIFFERENTIAL/PLATELET
Basophils Absolute: 0 10*3/uL (ref 0.0–0.1)
Basophils Relative: 0.6 % (ref 0.0–3.0)
Eosinophils Absolute: 0.1 10*3/uL (ref 0.0–0.7)
Eosinophils Relative: 2.8 % (ref 0.0–5.0)
HCT: 42.6 % (ref 39.0–52.0)
Hemoglobin: 14.3 g/dL (ref 13.0–17.0)
Lymphocytes Relative: 28 % (ref 12.0–46.0)
Lymphs Abs: 1.4 10*3/uL (ref 0.7–4.0)
MCHC: 33.5 g/dL (ref 30.0–36.0)
MCV: 86.1 fl (ref 78.0–100.0)
Monocytes Absolute: 0.3 10*3/uL (ref 0.1–1.0)
Monocytes Relative: 7.1 % (ref 3.0–12.0)
Neutro Abs: 3 10*3/uL (ref 1.4–7.7)
Neutrophils Relative %: 61.5 % (ref 43.0–77.0)
Platelets: 137 10*3/uL — ABNORMAL LOW (ref 150.0–400.0)
RBC: 4.95 Mil/uL (ref 4.22–5.81)
RDW: 13.3 % (ref 11.5–15.5)
WBC: 4.9 10*3/uL (ref 4.0–10.5)

## 2020-07-02 LAB — C-REACTIVE PROTEIN: CRP: <1 mg/dL (ref 0.5–20.0)

## 2020-07-02 LAB — SEDIMENTATION RATE: Sed Rate: 11 mm/hr (ref 0–20)

## 2020-07-02 MED ORDER — CIPROFLOXACIN HCL 500 MG PO TABS: 500.0000 mg | ORAL_TABLET | Freq: Two times a day (BID) | ORAL | 0 refills | Status: DC

## 2020-07-02 MED ORDER — METRONIDAZOLE 500 MG PO TABS: 500.0000 mg | ORAL_TABLET | Freq: Three times a day (TID) | ORAL | 0 refills | Status: AC

## 2020-07-07 ENCOUNTER — Encounter: Payer: Self-pay | Admitting: Family Medicine

## 2020-07-07 DIAGNOSIS — R1030 Lower abdominal pain, unspecified: Secondary | ICD-10-CM

## 2020-07-07 NOTE — Telephone Encounter (Signed)
OK, [facility] referral ordered 

## 2020-07-07 NOTE — Telephone Encounter (Signed)
Yes, finish abx

## 2020-07-07 NOTE — Telephone Encounter (Signed)
Patient was sent a separate message with [facility] contact information. Please advise if he should continue with antibiotic prescribed on 5/6

## 2020-07-09 ENCOUNTER — Encounter: Payer: Self-pay | Admitting: Gastroenterology

## 2020-07-13 ENCOUNTER — Encounter: Payer: Self-pay | Admitting: Gastroenterology

## 2020-07-16 ENCOUNTER — Ambulatory Visit: Payer: 59 | Admitting: Family Medicine

## 2020-08-06 ENCOUNTER — Ambulatory Visit (INDEPENDENT_AMBULATORY_CARE_PROVIDER_SITE_OTHER): Payer: 59 | Admitting: Family Medicine

## 2020-08-06 ENCOUNTER — Encounter: Payer: Self-pay | Admitting: Family Medicine

## 2020-08-06 ENCOUNTER — Other Ambulatory Visit: Payer: Self-pay

## 2020-08-06 VITALS — BP 99/68 | HR 75 | Temp 97.7°F | Resp 16 | Ht 69.25 in | Wt 172.2 lb

## 2020-08-06 DIAGNOSIS — Z125 Encounter for screening for malignant neoplasm of prostate: Secondary | ICD-10-CM

## 2020-08-06 DIAGNOSIS — R109 Unspecified abdominal pain: Secondary | ICD-10-CM

## 2020-08-06 DIAGNOSIS — R7301 Impaired fasting glucose: Secondary | ICD-10-CM | POA: Diagnosis not present

## 2020-08-06 DIAGNOSIS — Z23 Encounter for immunization: Secondary | ICD-10-CM

## 2020-08-06 DIAGNOSIS — Z0000 Encounter for general adult medical examination without abnormal findings: Secondary | ICD-10-CM | POA: Diagnosis not present

## 2020-08-06 LAB — CBC WITH DIFFERENTIAL/PLATELET
Basophils Absolute: 0 10*3/uL (ref 0.0–0.1)
Basophils Relative: 0.6 % (ref 0.0–3.0)
Eosinophils Absolute: 0.1 10*3/uL (ref 0.0–0.7)
Eosinophils Relative: 1.7 % (ref 0.0–5.0)
HCT: 43.3 % (ref 39.0–52.0)
Hemoglobin: 14.3 g/dL (ref 13.0–17.0)
Lymphocytes Relative: 30.4 % (ref 12.0–46.0)
Lymphs Abs: 1.6 10*3/uL (ref 0.7–4.0)
MCHC: 33.1 g/dL (ref 30.0–36.0)
MCV: 86.6 fl (ref 78.0–100.0)
Monocytes Absolute: 0.4 10*3/uL (ref 0.1–1.0)
Monocytes Relative: 7.7 % (ref 3.0–12.0)
Neutro Abs: 3 10*3/uL (ref 1.4–7.7)
Neutrophils Relative %: 59.6 % (ref 43.0–77.0)
Platelets: 156 10*3/uL (ref 150.0–400.0)
RBC: 5 Mil/uL (ref 4.22–5.81)
RDW: 13.3 % (ref 11.5–15.5)
WBC: 5.1 10*3/uL (ref 4.0–10.5)

## 2020-08-06 LAB — COMPREHENSIVE METABOLIC PANEL
ALT: 14 U/L (ref 0–53)
AST: 16 U/L (ref 0–37)
Albumin: 4.8 g/dL (ref 3.5–5.2)
Alkaline Phosphatase: 66 U/L (ref 39–117)
BUN: 18 mg/dL (ref 6–23)
CO2: 24 mEq/L (ref 19–32)
Calcium: 9.8 mg/dL (ref 8.4–10.5)
Chloride: 105 mEq/L (ref 96–112)
Creatinine, Ser: 1.11 mg/dL (ref 0.40–1.50)
GFR: 77.15 mL/min (ref >60.00)
Glucose, Bld: 93 mg/dL (ref 70–99)
Potassium: 4.7 mEq/L (ref 3.5–5.1)
Sodium: 140 mEq/L (ref 135–145)
Total Bilirubin: 1.2 mg/dL (ref 0.2–1.2)
Total Protein: 7 g/dL (ref 6.0–8.3)

## 2020-08-06 LAB — TSH: TSH: 2.55 u[IU]/mL (ref 0.35–4.50)

## 2020-08-06 LAB — LIPID PANEL
Cholesterol: 152 mg/dL (ref 0–200)
HDL: 45.5 mg/dL (ref >39.00)
LDL Cholesterol: 92 mg/dL (ref 0–99)
NonHDL: 106.83
Total CHOL/HDL Ratio: 3
Triglycerides: 72 mg/dL (ref 0.0–149.0)
VLDL: 14.4 mg/dL (ref 0.0–40.0)

## 2020-08-06 LAB — PSA: PSA: 0.54 ng/mL (ref 0.10–4.00)

## 2020-08-06 LAB — HEMOGLOBIN A1C: Hgb A1c MFr Bld: 5.7 % (ref 4.6–6.5)

## 2020-08-06 NOTE — Progress Notes (Signed)
Office Note 08/06/2020  CC:  Chief Complaint  Patient presents with   Annual Exam    Pt is fasting    HPI:  Johnny Atkins is a 50 y.o. male who is here for annual health maintenance exam. A/P as of last visit on 07/02/20: "1) Nonspecific lower abd pain, subacute, unclear etiology. Mild diverticulitis vs abd wall pain most likely dx at this point in time.  No red flags for IBD, biliary dz, or upper GI dz.  He denies constipation and has no urinary sx's.  No clear indication for abd imaging at this time. Plan is check cbc, cmet, esr, crp and do empiric abx for diverticulitis-->cipro 500 mg bid x 14d and flagyl 500 mg tid x 14d. If not improved at f/u in 2 wks then will ask GI to see him."  INTERIM HX: Not long after I saw him 1 mo ago pt called to state he was not improved and did want referral to GI so I ordered this. He has appt 08/16/20. Says the intermittent lower abd pain is still present, mild, no changes.     Exercising well, diet is pretty good. Started intermittent fasting this week.      Past Medical History:  Diagnosis Date   Allergy    seasonal   Arthritis    Primarily knees, mild osteo   Colon cancer screening    pt chose cologuard 09/2019   Gout    2 attacks in right MTP joint around 2005; 2 more flares in foot/toe 2013.  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 Barbaraann Barthel)   Wrist pain    For 2-3 years in his 55s.  Extensive multidisciplinary w/u at [facility] and [facility] revealed NO ABNORMALITY.  Improved with ibuprofen and essentially has spontaneously resolved.    History reviewed. No pertinent surgical history.  Family History  Problem Relation Age of Onset   Arthritis Mother        osteo (no gout)   Arthritis Father        osteo (no gout)    Colon polyps Neg Hx    Colon cancer Neg Hx    Esophageal cancer Neg Hx    Rectal cancer Neg Hx    Stomach cancer Neg Hx     Social History   Socioeconomic History   Marital status: Married    Spouse name: Not on file   Number of children: Not on file   Years of education: Not on file   Highest education level: Not on file  Occupational History   Not on file  Tobacco Use   Smoking status: Former    Pack years: 0.00    Types: Pipe, Cigars    Quit date: 2015    Years since quitting: 7.4   Smokeless tobacco: Never  Vaping Use   Vaping Use: Never used  Substance and Sexual Activity   Alcohol use: Yes    Alcohol/week: 0.0 standard drinks    Comment: 3-7 alcoholic beverages per week   Drug use: No   Sexual activity: Not on file  Other Topics Concern   Not on file  Social History Narrative   Married, 2 daughters (53 y/o and 22 y/o).   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   Regular Exercise: 1-2 days per week (elliptical machine 30 min).  He is a vegetarian (tends to eat lots of lentils, bread, rice).     No cigarettes but occasional cigar/pipe.  Two alcohol drinks per week (beer or whisky).   Born in [place], moved to [place] in 1994/04[place]14.  He is the youngest of 5 siblings.  All siblings healthy but one died in 04/14/1998 of a "stomach problem" rather suddenly.   ?BCG given in [place]?  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         Social Determinants of Health   Financial Resource Strain: Not on file  Food Insecurity: Not on file  Transportation Needs: Not on file  Physical Activity: Not on file  Stress: Not on file  Social Connections: Not on file  Intimate Partner Violence: Not on file    Outpatient Medications Prior to Visit  Medication Sig Dispense Refill   allopurinol (ZYLOPRIM) 300 MG tablet TAKE 1 AND 1/2 TABLETS BY MOUTH EVERY DAY 135 tablet 1   colchicine 0.6 MG tablet Take 0.6 mg by mouth daily. (Patient not  taking: No sig reported)     ibuprofen (ADVIL) 200 MG tablet Take 400 mg by mouth every 6 (six) hours as needed. (Patient not taking: No sig reported)     ciprofloxacin (CIPRO) 500 MG tablet Take 1 tablet (500 mg total) by mouth 2 (two) times daily. (Patient not taking: Reported on 08/06/2020) 28 tablet 0   No facility-administered medications prior to visit.    No Known Allergies  ROS Review of Systems  Constitutional:  Negative for appetite change, chills, fatigue and fever.  HENT:  Negative for congestion, dental problem, ear pain and sore throat.   Eyes:  Negative for discharge, redness and visual disturbance.  Respiratory:  Negative for cough, chest tightness, shortness of breath and wheezing.   Cardiovascular:  Negative for chest pain, palpitations and leg swelling.  Gastrointestinal:  Positive for abdominal pain (as per hpi). Negative for blood in stool, diarrhea, nausea and vomiting.  Genitourinary:  Negative for difficulty urinating, dysuria, flank pain, frequency, hematuria and urgency.  Musculoskeletal:  Negative for arthralgias, back pain, joint swelling, myalgias and neck stiffness.  Skin:  Negative for pallor and rash.  Neurological:  Negative for dizziness, speech difficulty, weakness and headaches.  Hematological:  Negative for adenopathy. Does not bruise/bleed easily.  Psychiatric/Behavioral:  Negative for confusion and sleep disturbance. The patient is not nervous/anxious.    PE; Vitals with BMI 08/06/2020 07/02/2020 06/25/2020  Height 5' 9.25" 5' 9.5" 5' 9.5"  Weight 172 lbs 3 oz 178 lbs 182 lbs 13 oz  BMI 25.24 02.58 52.77  Systolic 99 824 235  Diastolic 68 72 78  Pulse 75 93 68    Gen: Alert, well appearing.  Patient is oriented to person, place, time, and situation. AFFECT: pleasant, lucid thought and speech. ENT: Ears: EACs clear, normal epithelium.  TMs with good light reflex and landmarks bilaterally.  Eyes: no injection, icteris, swelling, or exudate.  EOMI,  PERRLA. Nose: no drainage or turbinate edema/swelling.  No injection or focal lesion.  Mouth: lips without lesion/swelling.  Oral mucosa pink and moist.  Dentition intact and without obvious caries or gingival swelling.  Oropharynx without erythema, exudate, or swelling.  Neck: supple/nontender.  No LAD, mass, or TM.  Carotid pulses 2+ bilaterally, without bruits. CV: RRR, no m/r/g.   LUNGS: CTA bilat, nonlabored  resps, good aeration in all lung fields. ABD: soft, NT, ND, BS normal.  No hepatospenomegaly or mass.  No bruits. EXT: no clubbing, cyanosis, or edema.  Musculoskeletal: no joint swelling, erythema, warmth, or tenderness.  ROM of all joints intact. Skin - no sores or suspicious lesions or rashes or color changes  Pertinent labs:  Lab Results  Component Value Date   TSH 2.87 08/07/2019   Lab Results  Component Value Date   WBC 4.9 07/02/2020   HGB 14.3 07/02/2020   HCT 42.6 07/02/2020   MCV 86.1 07/02/2020   PLT 137.0 (L) 07/02/2020   Lab Results  Component Value Date   CREATININE 1.12 07/02/2020   BUN 21 07/02/2020   NA 141 07/02/2020   K 4.5 07/02/2020   CL 105 07/02/2020   CO2 26 07/02/2020   Lab Results  Component Value Date   ALT 14 07/02/2020   AST 16 07/02/2020   ALKPHOS 70 07/02/2020   BILITOT 1.1 07/02/2020   Lab Results  Component Value Date   CHOL 113 08/07/2019   Lab Results  Component Value Date   HDL 33.70 (L) 08/07/2019   Lab Results  Component Value Date   LDLCALC 65 08/07/2019   Lab Results  Component Value Date   TRIG 70.0 08/07/2019   Lab Results  Component Value Date   CHOLHDL 3 08/07/2019   Lab Results  Component Value Date   PSA 0.53 08/07/2019   Lab Results  Component Value Date   HGBA1C 5.4 08/07/2019   Lab Results  Component Value Date   LABURIC 4.6 10/30/2019   Lab Results  Component Value Date   CRP <1.0 07/02/2020   Lab Results  Component Value Date   ESRSEDRATE 11 07/02/2020    ASSESSMENT AND PLAN:    Health maintenance exam: Reviewed age and gender appropriate health maintenance issues (prudent diet, regular exercise, health risks of tobacco and excessive alcohol, use of seatbelts, fire alarms in home, use of sunscreen).  Also reviewed age and gender appropriate health screening as well as vaccine recommendations. Vaccines: Shingrix #2 given today.  Otherwise all UTD. Labs: cbc and cmet normal 1 mo ago.  Given ongoing lower abd pain will go ahead and repeat cbc and cmet today.  Also get FLP, TSH, and Hba1c (IFG) today. Prostate ca screening: psa today. Colon ca screening:  neg cologuard 2021-->rpt 2024.  Recurrent lower abd pain: exam has been normal.  No red flag features. Blood work normal 1 mo ago. Has appt with GI for consult on 08/16/20.  An After Visit Summary was printed and given to the patient.  FOLLOW UP:  Return in about 1 year (around 08/06/2021) for annual CPE (fasting).  Signed:  Crissie Sickles, MD           08/06/2020

## 2020-08-06 NOTE — Addendum Note (Signed)
Addended by: Deveron Furlong D on: 08/06/2020 08:43 AM   Modules accepted: Orders

## 2020-08-06 NOTE — Patient Instructions (Signed)
Health Maintenance, Male Adopting a healthy lifestyle and getting preventive care are important in promoting health and wellness. Ask your health care provider about: The right schedule for you to have regular tests and exams. Things you can do on your own to prevent diseases and keep yourself healthy. What should I know about diet, weight, and exercise? Eat a healthy diet  Eat a diet that includes plenty of vegetables, fruits, low-fat dairy products, and lean protein. Do not eat a lot of foods that are high in solid fats, added sugars, or sodium.  Maintain a healthy weight Body mass index (BMI) is a measurement that can be used to identify possible weight problems. It estimates body fat based on height and weight. Your health care provider can help determine your BMI and help you achieve or maintain ahealthy weight. Get regular exercise Get regular exercise. This is one of the most important things you can do for your health. Most adults should: Exercise for at least 150 minutes each week. The exercise should increase your heart rate and make you sweat (moderate-intensity exercise). Do strengthening exercises at least twice a week. This is in addition to the moderate-intensity exercise. Spend less time sitting. Even light physical activity can be beneficial. Watch cholesterol and blood lipids Have your blood tested for lipids and cholesterol at 51 years of age, then havethis test every 5 years. You may need to have your cholesterol levels checked more often if: Your lipid or cholesterol levels are high. You are older than 51 years of age. You are at high risk for heart disease. What should I know about cancer screening? Many types of cancers can be detected early and may often be prevented. Depending on your health history and family history, you may need to have cancer screening at various ages. This may include screening for: Colorectal cancer. Prostate cancer. Skin cancer. Lung  cancer. What should I know about heart disease, diabetes, and high blood pressure? Blood pressure and heart disease High blood pressure causes heart disease and increases the risk of stroke. This is more likely to develop in people who have high blood pressure readings, are of African descent, or are overweight. Talk with your health care provider about your target blood pressure readings. Have your blood pressure checked: Every 3-5 years if you are 18-39 years of age. Every year if you are 40 years old or older. If you are between the ages of 65 and 75 and are a current or former smoker, ask your health care provider if you should have a one-time screening for abdominal aortic aneurysm (AAA). Diabetes Have regular diabetes screenings. This checks your fasting blood sugar level. Have the screening done: Once every three years after age 45 if you are at a normal weight and have a low risk for diabetes. More often and at a younger age if you are overweight or have a high risk for diabetes. What should I know about preventing infection? Hepatitis B If you have a higher risk for hepatitis B, you should be screened for this virus. Talk with your health care provider to find out if you are at risk forhepatitis B infection. Hepatitis C Blood testing is recommended for: Everyone born from 1945 through 1965. Anyone with known risk factors for hepatitis C. Sexually transmitted infections (STIs) You should be screened each year for STIs, including gonorrhea and chlamydia, if: You are sexually active and are younger than 51 years of age. You are older than 51 years of age   and your health care provider tells you that you are at risk for this type of infection. Your sexual activity has changed since you were last screened, and you are at increased risk for chlamydia or gonorrhea. Ask your health care provider if you are at risk. Ask your health care provider about whether you are at high risk for HIV.  Your health care provider may recommend a prescription medicine to help prevent HIV infection. If you choose to take medicine to prevent HIV, you should first get tested for HIV. You should then be tested every 3 months for as long as you are taking the medicine. Follow these instructions at home: Lifestyle Do not use any products that contain nicotine or tobacco, such as cigarettes, e-cigarettes, and chewing tobacco. If you need help quitting, ask your health care provider. Do not use street drugs. Do not share needles. Ask your health care provider for help if you need support or information about quitting drugs. Alcohol use Do not drink alcohol if your health care provider tells you not to drink. If you drink alcohol: Limit how much you have to 0-2 drinks a day. Be aware of how much alcohol is in your drink. In the [place] one drink equals one 12 oz bottle of beer (355 mL), one 5 oz glass of wine (148 mL), or one 1 oz glass of hard liquor (44 mL). General instructions Schedule regular health, dental, and eye exams. Stay current with your vaccines. Tell your health care provider if: You often feel depressed. You have ever been abused or do not feel safe at home. Summary Adopting a healthy lifestyle and getting preventive care are important in promoting health and wellness. Follow your health care provider's instructions about healthy diet, exercising, and getting tested or screened for diseases. Follow your health care provider's instructions on monitoring your cholesterol and blood pressure. This information is not intended to replace advice given to you by your health care provider. Make sure you discuss any questions you have with your healthcare provider. Document Revised: 02/06/2018 Document Reviewed: 02/06/2018 Elsevier Patient Education  2022 [employer]  

## 2020-08-16 ENCOUNTER — Other Ambulatory Visit: Payer: Self-pay

## 2020-08-16 ENCOUNTER — Ambulatory Visit (INDEPENDENT_AMBULATORY_CARE_PROVIDER_SITE_OTHER): Payer: 59 | Admitting: Gastroenterology

## 2020-08-16 ENCOUNTER — Encounter: Payer: Self-pay | Admitting: Gastroenterology

## 2020-08-16 VITALS — BP 122/78 | HR 70 | Ht 69.25 in | Wt 176.0 lb

## 2020-08-16 DIAGNOSIS — R1030 Lower abdominal pain, unspecified: Secondary | ICD-10-CM | POA: Diagnosis not present

## 2020-08-16 NOTE — Progress Notes (Signed)
Chief Complaint: Lower abdominal pain.  Referring Provider:  Tammi Sou, MD      ASSESSMENT AND PLAN;   #1. Lower abdo pain  #2. Small umbilical hernia.  Recommend watchful waiting.  #3.  Colorectal cancer screening. Neg Cologuard 10/2019  Plan: -CT AP with p.o. and IV contrast. -Use biofreeze PRN -If still with problems, will give a trial of Levsin. -Stop running x 1 week.  I have reassured the patient.  He is to get in touch with [place] in case of any problems.   HPI:    Johnny Atkins is a 50 y.o. male   Lower abdo pain x 2 months, started while he was in [place] on vacation.  Associated with lower abdominal bloating.  No diarrhea or constipation.  It occurs when he wakes up in the morning at fasting state and at bedtime.  No associated nausea, vomiting, heartburn, regurgitation, odynophagia or dysphagia.  No fever chills or night sweats.  No recent weight loss.  He had normal CBC (except borderline platelet count), CMP, TSH, PSA, hemoglobin A1c, CRP, UA,  A week ago.  No family history of colon cancer or colonic polyps.  Had negative Cologuard 10/31/2019  Has somewhat more bloating ever since he has been on allopurinol for gout prophylaxis.  Past Medical History:  Diagnosis Date   Allergy    seasonal   Arthritis    Primarily knees, mild osteo   Colon cancer screening    pt chose cologuard 09/2019   Gout    2 attacks in right MTP joint around 2005; 2 more flares in foot/toe 2013.  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 Barbaraann Barthel)   Wrist pain    For 2-3 years in his 15s.  Extensive multidisciplinary w/u at [facility] and [facility] revealed NO ABNORMALITY.  Improved with ibuprofen and essentially has spontaneously resolved.    History reviewed. No  pertinent surgical history.  Family History  Problem Relation Age of Onset   Arthritis Mother        osteo (no gout)   Arthritis Father        osteo (no gout)   Colon polyps Neg Hx    Colon cancer Neg Hx    Esophageal cancer Neg Hx    Rectal cancer Neg Hx    Stomach cancer Neg Hx     Social History   Tobacco Use   Smoking status: Former    Pack years: 0.00    Types: Pipe, Cigars    Quit date: 2015    Years since quitting: 7.4   Smokeless tobacco: Never  Vaping Use   Vaping Use: Never used  Substance Use Topics   Alcohol use: Yes    Alcohol/week: 0.0 standard drinks    Comment: 3-7 alcoholic beverages per week   Drug use: No    Current Outpatient Medications  Medication Sig Dispense Refill   allopurinol (ZYLOPRIM) 300 MG tablet TAKE 1 AND 1/2 TABLETS BY MOUTH EVERY DAY 135 tablet 1   No current facility-administered medications for this visit.    No Known Allergies  Review of Systems:  Constitutional: Denies fever, chills, diaphoresis, appetite change and fatigue.  HEENT: Denies photophobia, eye pain, redness, hearing loss, ear pain,  congestion, sore throat, rhinorrhea, sneezing, mouth sores, neck pain, neck stiffness and tinnitus.   Respiratory: Denies SOB, DOE, cough, chest tightness,  and wheezing.   Cardiovascular: Denies chest pain, palpitations and leg swelling.  Genitourinary: Denies dysuria, urgency, frequency, hematuria, flank pain and difficulty urinating.  Musculoskeletal: Denies myalgias, back pain, joint swelling, arthralgias and gait problem.  Skin: No rash.  Neurological: Denies dizziness, seizures, syncope, weakness, light-headedness, numbness and headaches.  Hematological: Denies adenopathy. Easy bruising, personal or family bleeding history  Psychiatric/Behavioral: No anxiety or depression     Physical Exam:    BP 122/78   Pulse 70   Ht 5' 9.25" (1.759 m)   Wt 176 lb (79.8 kg)   SpO2 99%   BMI 25.80 kg/m  Wt Readings from Last 3  Encounters:  08/16/20 176 lb (79.8 kg)  08/06/20 172 lb 3.2 oz (78.1 kg)  07/02/20 178 lb (80.7 kg)   Constitutional:  Well-developed, in no acute distress. Psychiatric: Normal mood and affect. Behavior is normal. HEENT: Pupils normal.  Conjunctivae are normal. No scleral icterus.  Cardiovascular: Normal rate, regular rhythm. No edema Pulmonary/chest: Effort normal and breath sounds normal. No wheezing, rales or rhonchi. Abdominal: Soft, nondistended. Nontender. Bowel sounds active throughout. There are no masses palpable. No hepatomegaly.  Has a small umbilical hernia.  Few small lipomas anterior chest and abdominal wall.  Mild lower abdominal tenderness. Rectal: Deferred Neurological: Alert and oriented to person place and time. Skin: Skin is warm and dry. No rashes noted.  Data Reviewed: I have personally reviewed following labs and imaging studies  CBC: CBC Latest Ref Rng & Units 08/06/2020 07/02/2020 08/07/2019  WBC 4.0 - 10.5 K/uL 5.1 4.9 4.5  Hemoglobin 13.0 - 17.0 g/dL 14.3 14.3 13.8  Hematocrit 39.0 - 52.0 % 43.3 42.6 42.3  Platelets 150.0 - 400.0 K/uL 156.0 137.0(L) 152.0    CMP: CMP Latest Ref Rng & Units 08/06/2020 07/02/2020 08/07/2019  Glucose 70 - 99 mg/dL 93 90 99  BUN 6 - 23 mg/dL 18 21 12   Creatinine 0.40 - 1.50 mg/dL 1.11 1.12 0.97  Sodium 135 - 145 mEq/L 140 141 139  Potassium 3.5 - 5.1 mEq/L 4.7 4.5 4.7  Chloride 96 - 112 mEq/L 105 105 106  CO2 19 - 32 mEq/L 24 26 29   Calcium 8.4 - 10.5 mg/dL 9.8 9.6 9.2  Total Protein 6.0 - 8.3 g/dL 7.0 6.9 6.8  Total Bilirubin 0.2 - 1.2 mg/dL 1.2 1.1 0.7  Alkaline Phos 39 - 117 U/L 66 70 70  AST 0 - 37 U/L 16 16 15   ALT 0 - 53 U/L 14 14 14         Carmell Austria, MD 08/16/2020, 2:09 PM  Cc: McGowen, Adrian Blackwater, MD

## 2020-08-16 NOTE — Patient Instructions (Signed)
If you are age 51 or older, your body mass index should be between 23-30. Your Body mass index is 25.8 kg/m. If this is out of the aforementioned range listed, please consider follow up with your Primary Care Provider.  If you are age 59 or younger, your body mass index should be between 19-25. Your Body mass index is 25.8 kg/m. If this is out of the aformentioned range listed, please consider follow up with your Primary Care Provider.   __________________________________________________________  The [facility] providers would like to encourage you to use [facility] to communicate with providers for non-urgent requests or questions.  Due to long hold times on the telephone, sending your provider a message by [facility] may be a faster and more efficient way to get a response.  Please allow 48 business hours for a response.  Please remember that this is for non-urgent requests.   You have been scheduled [facility] CT [facility] [place] [address] [facility]   You are scheduled on       at        . You should arrive 15 minutes prior to your appointment time for registration. Please follow the written instructions below on the day of your exam:  WARNING: IF YOU ARE ALLERGIC TO IODINE/X-RAY DYE, PLEASE NOTIFY RADIOLOGY IMMEDIATELY AT [phone]! YOU WILL BE GIVEN A 13 HOUR PREMEDICATION PREP.  1) Do not eat or drink anything after     (4 hours prior to your test) 2) You have been given 2 bottles of oral contrast to drink. The solution may taste better if refrigerated, but do NOT add ice or any other liquid to this solution. Shake well before drinking.    Drink 1 bottle of contrast @         2  hours prior to your exam)  Drink 1 bottle of contrast @        (1 hour prior to your exam)  You may take any medications as prescribed with a small amount of water, if necessary. If you take any of the following medications: METFORMIN,  GLUCOPHAGE, GLUCOVANCE, AVANDAMET, RIOMET, FORTAMET, [place] MET, JANUMET, GLUMETZA or METAGLIP, you MAY be asked to HOLD this medication 48 hours AFTER the exam.  The purpose of you drinking the oral contrast is to aid in the visualization of your intestinal tract. The contrast solution may cause some diarrhea. Depending on your individual set of symptoms, you may also receive an intravenous injection of x-ray contrast/dye. Plan on being at [facility] for 30 minutes or longer, depending on the type of exam you are having performed.  This test typically takes 30-45 minutes to complete.  If you have any questions regarding your exam or if you need to reschedule, you may call the CT department at [phone] between the hours of 8:00 am and 5:00 pm, Monday-Friday.  ________________________________________________________________________

## 2020-08-17 ENCOUNTER — Telehealth: Payer: Self-pay

## 2020-08-17 NOTE — Telephone Encounter (Signed)
Patient made aware that CT was approved for [facility]. He has the number as [phone] and the order and insurance card was sent to them

## 2020-09-02 ENCOUNTER — Ambulatory Visit
Admission: RE | Admit: 2020-09-02 | Discharge: 2020-09-02 | Disposition: A | Discharge status: Home / Self Care | Payer: 59 | Source: Ambulatory Visit | Attending: Gastroenterology | Admitting: Gastroenterology

## 2020-09-02 ENCOUNTER — Other Ambulatory Visit: Payer: Self-pay

## 2020-09-02 DIAGNOSIS — R1030 Lower abdominal pain, unspecified: Secondary | ICD-10-CM

## 2020-09-02 MED ORDER — IOPAMIDOL (ISOVUE-300) INJECTION 61%
100.0000 mL | Freq: Once | INTRAVENOUS | Status: AC | PRN
  Administered 2020-09-02: 100 mL via INTRAVENOUS

## 2020-09-06 ENCOUNTER — Telehealth: Payer: Self-pay

## 2020-09-06 NOTE — Telephone Encounter (Signed)
Patient is returning your call.  

## 2020-09-06 NOTE — Progress Notes (Signed)
CT -reviewed.  No acute abnormalities. Small liver lesions -likely cysts. He does have small umbilical hernia. L5-S1 bilateral foraminal impingement. I have sent him a message.  If still with abdominal pain, proceed with colonoscopy. If with back pain, he needs to see Dr. Anitra Lauth (may need MRI LS spine)

## 2020-09-06 NOTE — Telephone Encounter (Signed)
-----   Message from [name], MD sent at 09/06/2020 10:40 AM EDT ----- CT -reviewed.  No acute abnormalities. Small liver lesions -likely cysts. He does have small umbilical hernia. L5-S1 bilateral foraminal impingement. I have sent him a message.  If still with abdominal pain, proceed with colonoscopy. If with back pain, he needs to see Dr. Anitra Lauth (may need MRI LS spine)

## 2020-09-06 NOTE — Telephone Encounter (Signed)
Patient said that he is still having abdominal pain and he hasn't taken anything for it. He is scheduled for 8-4 and wanted to let you know as far as the back pain he said no not really so I told him that if he still have back pain he might want to reach out to his PCP due to his results.

## 2020-09-06 NOTE — Telephone Encounter (Signed)
LVM for patient to call back. ?

## 2020-09-07 NOTE — Telephone Encounter (Signed)
Proceed with colonoscopy RG

## 2020-09-08 ENCOUNTER — Ambulatory Visit (AMBULATORY_SURGERY_CENTER): Payer: 59

## 2020-09-08 ENCOUNTER — Other Ambulatory Visit: Payer: Self-pay

## 2020-09-08 VITALS — Ht 68.0 in | Wt 170.0 lb

## 2020-09-08 DIAGNOSIS — Z1211 Encounter for screening for malignant neoplasm of colon: Secondary | ICD-10-CM

## 2020-09-08 DIAGNOSIS — R1030 Lower abdominal pain, unspecified: Secondary | ICD-10-CM

## 2020-09-08 MED ORDER — PLENVU 140 G PO SOLR: 1.0000 | ORAL | 0 refills | Status: DC

## 2020-09-08 NOTE — Progress Notes (Signed)
Pre visit completed via phone call; Patient verified name, DOB, and address; No egg or soy allergy known to patient  No issues with past sedation with any surgeries or procedures Patient denies ever being told they had issues or difficulty with intubation  No FH of Malignant Hyperthermia No diet pills per patient No home 02 use per patient  No blood thinners per patient  Pt denies issues with constipation  No A fib or A flutter  EMMI video via MyChart  COVID 19 guidelines implemented in PV today with Pt and RN  Pt is fully vaccinated for Covid x 2 + booster;  Coupon given to pt in PV today, Code to Pharmacy and NO PA's for preps discussed with pt in PV today  Discussed with pt there will be an out-of-pocket cost for prep and that varies from $0 to 70 dollars   Due to the COVID-19 pandemic we are asking patients to follow certain guidelines.  Pt aware of COVID protocols and [facility] guidelines

## 2020-09-27 HISTORY — PX: COLONOSCOPY W/ POLYPECTOMY: SHX1380

## 2020-09-30 ENCOUNTER — Encounter: Payer: Self-pay | Admitting: Gastroenterology

## 2020-09-30 ENCOUNTER — Other Ambulatory Visit: Payer: Self-pay

## 2020-09-30 ENCOUNTER — Ambulatory Visit (AMBULATORY_SURGERY_CENTER): Payer: 59 | Admitting: Gastroenterology

## 2020-09-30 VITALS — BP 101/63 | HR 60 | Temp 98.4°F | Resp 25 | Ht 68.0 in | Wt 170.0 lb

## 2020-09-30 DIAGNOSIS — K635 Polyp of colon: Secondary | ICD-10-CM

## 2020-09-30 DIAGNOSIS — Z1211 Encounter for screening for malignant neoplasm of colon: Secondary | ICD-10-CM | POA: Diagnosis present

## 2020-09-30 DIAGNOSIS — R1030 Lower abdominal pain, unspecified: Secondary | ICD-10-CM | POA: Diagnosis not present

## 2020-09-30 DIAGNOSIS — D122 Benign neoplasm of ascending colon: Secondary | ICD-10-CM

## 2020-09-30 DIAGNOSIS — D120 Benign neoplasm of cecum: Secondary | ICD-10-CM

## 2020-09-30 MED ORDER — SODIUM CHLORIDE 0.9 % IV SOLN: 500.0000 mL | Freq: Once | INTRAVENOUS | Status: DC

## 2020-09-30 NOTE — Progress Notes (Signed)
Vital signs checked by:CW  The patient states no changes in medical or surgical history since pre-visit screening on 09/08/2020.

## 2020-09-30 NOTE — Progress Notes (Signed)
Report given to PACU, vss 

## 2020-09-30 NOTE — Patient Instructions (Signed)
Handouts on polyps & hemorrhoids given to you today   Await pathology results on polyps removed     YOU HAD AN ENDOSCOPIC PROCEDURE TODAY AT [facility]:   Refer to the procedure report that was given to you for any specific questions about what was found during the examination.  If the procedure report does not answer your questions, please call your gastroenterologist to clarify.  If you requested that your care partner not be given the details of your procedure findings, then the procedure report has been included in a sealed envelope for you to review at your convenience later.  YOU SHOULD EXPECT: Some feelings of bloating in the abdomen. Passage of more gas than usual.  Walking can help get rid of the air that was put into your GI tract during the procedure and reduce the bloating. If you had a lower endoscopy (such as a colonoscopy or flexible sigmoidoscopy) you may notice spotting of blood in your stool or on the toilet paper. If you underwent a bowel prep for your procedure, you may not have a normal bowel movement for a few days.  Please Note:  You might notice some irritation and congestion in your nose or some drainage.  This is from the oxygen used during your procedure.  There is no need for concern and it should clear up in a day or so.  SYMPTOMS TO REPORT IMMEDIATELY:  Following lower endoscopy (colonoscopy or flexible sigmoidoscopy):  Excessive amounts of blood in the stool  Significant tenderness or worsening of abdominal pains  Swelling of the abdomen that is new, acute  Fever of 100F or higher   For urgent or emergent issues, a gastroenterologist can be reached at any hour by calling [phone]. Do not use MyChart messaging for urgent concerns.    DIET:  We do recommend a small meal at first, but then you may proceed to your regular diet.  Drink plenty of fluids but you should avoid alcoholic beverages for 24 hours.  ACTIVITY:  You should plan  to take it easy for the rest of today and you should NOT DRIVE or use heavy machinery until tomorrow (because of the sedation medicines used during the test).    FOLLOW UP: Our staff will call the number listed on your records 48-72 hours following your procedure to check on you and address any questions or concerns that you may have regarding the information given to you following your procedure. If we do not reach you, we will leave a message.  We will attempt to reach you two times.  During this call, we will ask if you have developed any symptoms of COVID 19. If you develop any symptoms (ie: fever, flu-like symptoms, shortness of breath, cough etc.) before then, please call [phone].  If you test positive for Covid 19 in the 2 weeks post procedure, please call and report this information to [place].    If any biopsies were taken you will be contacted by phone or by letter within the next 1-3 weeks.  Please call us at [phone] if you have not heard about the biopsies in 3 weeks.    SIGNATURES/CONFIDENTIALITY: You and/or your care partner have signed paperwork which will be entered into your electronic medical record.  These signatures attest to the fact that that the information above on your After Visit Summary has been reviewed and is understood.  Full responsibility of the confidentiality of this discharge information lies with you  and/or your care-partner.

## 2020-09-30 NOTE — Op Note (Signed)
[facility] Patient Name: Johnny Atkins Procedure Date: 09/30/2020 7:35 AM MRN: [id number] Endoscopist: [name] , MD Age: 50 Referring MD:  Date of Birth: 1971-01-21 Gender: Male Account #: [id number] Procedure:                Colonoscopy Indications:              Screening for colorectal malignant neoplasm Medicines:                Monitored Anesthesia Care Procedure:                Pre-Anesthesia Assessment:                           - Prior to the procedure, a History and Physical                            was performed, and patient medications and                            allergies were reviewed. The patient's tolerance of                            previous anesthesia was also reviewed. The risks                            and benefits of the procedure and the sedation                            options and risks were discussed with the patient.                            All questions were answered, and informed consent                            was obtained. Prior Anticoagulants: The patient has                            taken no previous anticoagulant or antiplatelet                            agents. ASA Grade Assessment: II - A patient with                            mild systemic disease. After reviewing the risks                            and benefits, the patient was deemed in                            satisfactory condition to undergo the procedure.                           After obtaining informed consent, the colonoscope  was passed under direct vision. Throughout the                            procedure, the patient's blood pressure, pulse, and                            oxygen saturations were monitored continuously. The                            CF [id number] was introduced through the anus                            and advanced to the 2 cm into the ileum. The                            colonoscopy was performed  without difficulty. The                            patient tolerated the procedure well. The quality                            of the bowel preparation was good. The TI,                            ileocecal valve, appendiceal orifice, and rectum                            were photographed. Scope In: 8:14:35 AM Scope Out: 8:25:50 AM Scope Withdrawal Time: 0 hours 8 minutes 5 seconds  Total Procedure Duration: 0 hours 11 minutes 15 seconds  Findings:                 Two sessile polyps were found in the proximal                            ascending colon and cecum. The polyps were 4 to 6                            mm in size. These polyps were removed with a cold                            snare. Resection and retrieval were complete.                           Non-bleeding internal hemorrhoids were found during                            retroflexion. The hemorrhoids were small.                           The terminal ileum appeared normal.                           The exam was otherwise without abnormality on  direct and retroflexion views. Complications:            No immediate complications. Estimated Blood Loss:     Estimated blood loss: none. Impression:               - Two 4 to 6 mm polyps in the proximal ascending                            colon and in the cecum, removed with a cold snare.                            Resected and retrieved.                           - Non-bleeding internal hemorrhoids.                           - The examined portion of the ileum was normal.                           - The examination was otherwise normal on direct                            and retroflexion views. Recommendation:           - Patient has a contact number available for                            emergencies. The signs and symptoms of potential                            delayed complications were discussed with the                            patient.  Return to normal activities tomorrow.                            Written discharge instructions were provided to the                            patient.                           - Resume previous diet.                           - Continue present medications.                           - Await pathology results.                           - Repeat colonoscopy for surveillance based on                            pathology results.                           -  Return to GI clinic PRN.                           - The findings and recommendations were discussed                            with the patient's family. [name], MD 09/30/2020 8:32:38 AM This report has been signed electronically.

## 2020-09-30 NOTE — Progress Notes (Signed)
Called to room to assist during endoscopic procedure.  Patient ID and intended procedure confirmed with present staff. Received instructions for my participation in the procedure from the performing physician.  

## 2020-10-04 ENCOUNTER — Telehealth: Payer: Self-pay

## 2020-10-04 NOTE — Telephone Encounter (Signed)
  Follow up Call-  Call back number 09/30/2020  Post procedure Call Back phone  # [phone]  Permission to leave phone message Yes  Some recent data might be hidden     Patient questions:  Do you have a fever, pain , or abdominal swelling? No. Pain Score  0 *  Have you tolerated food without any problems? Yes.    Have you been able to return to your normal activities? Yes.    Do you have any questions about your discharge instructions: Diet   No. Medications  No. Follow up visit  No.  Do you have questions or concerns about your Care? No.  Actions: * If pain score is 4 or above: No action needed, pain <4.

## 2020-10-08 ENCOUNTER — Encounter: Payer: Self-pay | Admitting: Gastroenterology

## 2020-11-23 ENCOUNTER — Telehealth (INDEPENDENT_AMBULATORY_CARE_PROVIDER_SITE_OTHER): Payer: 59 | Admitting: Family Medicine

## 2020-11-23 ENCOUNTER — Encounter: Payer: Self-pay | Admitting: Family Medicine

## 2020-11-23 VITALS — Temp 99.0°F

## 2020-11-23 DIAGNOSIS — U071 COVID-19: Secondary | ICD-10-CM | POA: Diagnosis not present

## 2020-11-23 MED ORDER — ALBUTEROL SULFATE HFA 108 (90 BASE) MCG/ACT IN AERS: 2.0000 | INHALATION_SPRAY | Freq: Four times a day (QID) | RESPIRATORY_TRACT | 0 refills | Status: DC | PRN

## 2020-11-23 MED ORDER — NIRMATRELVIR/RITONAVIR (PAXLOVID)TABLET: 3.0000 | ORAL_TABLET | Freq: Two times a day (BID) | ORAL | 0 refills | Status: AC

## 2020-11-23 MED ORDER — BENZONATATE 200 MG PO CAPS: 200.0000 mg | ORAL_CAPSULE | Freq: Two times a day (BID) | ORAL | 0 refills | Status: DC | PRN

## 2020-11-23 NOTE — Progress Notes (Signed)
Virtual Visit via Video Note  I connected with Johnny Atkins  on 11/23/20 at  6:20 PM EDT by a video enabled telemedicine application and verified that I am speaking with the correct person using two identifiers.  Location patient: home, [place] Location provider:work or home office Persons participating in the virtual visit: patient, provider  I discussed the limitations of evaluation and management by telemedicine and the availability of in person appointments. The patient expressed understanding and agreed to proceed.   HPI:  Acute telemedicine visit for Covid19: -Onset: 2 days ago, positive at home covid tests x 2 -Symptoms include: nasal congestion, sore throat, cough, mild fever, chills, had some mild "congestion" yesterday in the nose and made breathing through the nose difficult -Denies:CP, SOB today, NV, inability to eat/drink/get out of bed -Has tried:tylenol -Pertinent past medical history: asthma listed in his chart but he doesn't think he has asthma - reports did use in inhaler remotely -Pertinent medication allergies: No Known Allergies -COVID-19 vaccine status: fully vaccinated x2 and had 2 boosters -GFR was 77 -he does wish to take and antiviral and asks if feeling better if he can run a marathon this weekend  ROS: See pertinent positives and negatives per HPI.  Past Medical History:  Diagnosis Date   Arthritis    Primarily knees, mild osteo   Colon cancer screening    pt chose cologuard 09/2019   Gout    2 attacks in right MTP joint around 2005; 2 more flares in foot/toe 2013.  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 Barbaraann Barthel)   Seasonal allergies    Wrist pain    For 2-3 years in his 24s.  Extensive multidisciplinary w/u at [facility] and [facility] revealed NO  ABNORMALITY.  Improved with ibuprofen and essentially has spontaneously resolved.    History reviewed. No pertinent surgical history.   Current Outpatient Medications:    albuterol (PROAIR HFA) 108 (90 Base) MCG/ACT inhaler, Inhale 2 puffs into the lungs every 6 (six) hours as needed for wheezing or shortness of breath., Disp: 1 each, Rfl: 0   allopurinol (ZYLOPRIM) 300 MG tablet, TAKE 1 AND 1/2 TABLETS BY MOUTH EVERY DAY (Patient taking differently: TAKE 1 TABLET BY MOUTH EVERY DAY), Disp: 135 tablet, Rfl: 1   benzonatate (TESSALON) 200 MG capsule, Take 1 capsule (200 mg total) by mouth 2 (two) times daily as needed for cough., Disp: 20 capsule, Rfl: 0   nirmatrelvir/ritonavir EUA (PAXLOVID) 20 x 150 MG & 10 x 100MG  TABS, Take 3 tablets by mouth 2 (two) times daily for 5 days. (Take nirmatrelvir 150 mg two tablets twice daily for 5 days and ritonavir 100 mg one tablet twice daily for 5 days) Patient GFR is > 60 3 months ago, Disp: 30 tablet, Rfl: 0  EXAM:  VITALS per patient if applicable:  GENERAL: alert, oriented, appears well and in no acute distress  HEENT: atraumatic, conjunttiva clear, no obvious abnormalities on inspection of external nose and ears  NECK: normal movements of the head and neck  LUNGS: on inspection no signs of respiratory distress, breathing rate appears normal, no obvious gross SOB, gasping or wheezing  CV: no obvious cyanosis  MS: moves all visible extremities without noticeable abnormality  PSYCH/NEURO: pleasant and cooperative,  no obvious depression or anxiety, speech and thought processing grossly intact  ASSESSMENT AND PLAN:  Discussed the following assessment and plan:  COVID-19   Discussed treatment options (infusions and oral options and risk of drug interactions), ideal treatment window, potential complications, isolation and precautions for COVID-19.  Discussed possibility of rebound with antivirals and the need to reisolate if it should occur  for 5 days. Checked for/reviewed any labs done in the last 90 days with GFR listed in HPI if available. After lengthy discussion, the patient opted for treatment with Paxlovid due to being higher risk for complications of covid or severe disease and other factors. Discussed EUA status of this drug and the fact that there is preliminary limited knowledge of risks/interactions/side effects per EUA document vs possible benefits and precautions. This information was shared with patient during the visit and also was provided in patient instructions. Also, advised that patient discuss risks/interactions and use with pharmacist/treatment team as well. ADvised not to run marathon. Refilled albuterol. The patient did want a prescription for cough, Tessalon Rx sent.  Other symptomatic care measures summarized in patient instructions. Work/School slipped offered: provided in patient instructions   Advised to seek prompt in person care if worsening, new symptoms arise, or if is not improving with treatment. Discussed options for inperson care if PCP office not available. Did let this patient know that I only do telemedicine on Tuesdays and Thursdays for [place]. Advised to schedule follow up visit with PCP or [facility] if any further questions or concerns to avoid delays in care.   I discussed the assessment and treatment plan with the patient. The patient was provided an opportunity to ask questions and all were answered. The patient agreed with the plan and demonstrated an understanding of the instructions.     Lucretia Kern, DO

## 2020-11-23 NOTE — Patient Instructions (Addendum)
---------------------------------------------------------------------------------------------------------------------------    WORK SLIP:  Patient Johnny Atkins,  04/19/1970, was seen for a medical visit today, 11/23/20 . Please excuse from work for a COVID like illness. We advise 10 days minimum from the onset of symptoms (11/21/20) PLUS 1 day of no fever and improved symptoms. Will defer to employer for a sooner return to work if symptoms have resolved, it is greater than 5 days since the positive test and the patient can wear a high-quality, tight fitting mask such as N95 or KN95 at all times for an additional 5 days. Would also suggest COVID19 antigen testing is negative prior to return.  Sincerely: E-signature: Dr. Colin Benton, DO [facility] Ph: [phone]   ------------------------------------------------------------------------------------------------------------------------------   HOME CARE TIPS:  -I sent the medication(s) we discussed to your pharmacy: Meds ordered this encounter  Medications   nirmatrelvir/ritonavir EUA (PAXLOVID) 20 x 150 MG & 10 x 100MG TABS    Sig: Take 3 tablets by mouth 2 (two) times daily for 5 days. (Take nirmatrelvir 150 mg two tablets twice daily for 5 days and ritonavir 100 mg one tablet twice daily for 5 days) Patient GFR is > 60 3 months ago    Dispense:  30 tablet    Refill:  0   benzonatate (TESSALON) 200 MG capsule    Sig: Take 1 capsule (200 mg total) by mouth 2 (two) times daily as needed for cough.    Dispense:  20 capsule    Refill:  0   albuterol (PROAIR HFA) 108 (90 Base) MCG/ACT inhaler    Sig: Inhale 2 puffs into the lungs every 6 (six) hours as needed for wheezing or shortness of breath.    Dispense:  1 each    Refill:  0     -I sent in the [place] treatment or referral you requested per our discussion. Please see the information provided below and discuss further with the pharmacist/treatment team.   -If taking Paxlovid, please review all medications, supplement and over the counter drugs with your pharmacist and ask them to check for any interactions.   -If taking Paxlovid, there is a chance of rebound illness after finishing your treatment. If you become sick again please isolate for an additional 5 days.    -can use tylenol or aleve if needed for fevers, aches and pains per instructions  -can use nasal saline a few times per day if you have nasal congestion  -stay hydrated, drink plenty of fluids and eat small healthy meals - avoid dairy  -follow up with your doctor in 2-3 days unless improving and feeling better  -stay home while sick, except to seek medical care. If you have COVID19, ideally it would be best to stay home for a full 10 days since the onset of symptoms PLUS one day of no fever and feeling better. Wear a good mask that fits snugly (such as N95 or KN95) if around others to reduce the risk of transmission.  It was nice to meet you today, and I really hope you are feeling better soon. I help [place] out with telemedicine visits on Tuesdays and Thursdays and am available for visits on those days. If you have any concerns or questions following this visit please schedule a follow up visit with your Primary Care doctor or seek care at a local urgent care clinic to avoid delays in care.    Seek in person care or schedule a follow up video visit  promptly if your symptoms worsen, new concerns arise or you are not improving with treatment. Call 911 and/or seek emergency care if your symptoms are severe or life threatening.  FACT SHEET FOR PATIENTS, PARENTS, AND CAREGIVERS EMERGENCY USE AUTHORIZATION (EUA) OF PAXLOVID FOR CORONAVIRUS DISEASE 2019 (COVID-19) You are being given this Fact Sheet because your healthcare provider believes it is necessary to provide you with PAXLOVID for the treatment of mild-to-moderate coronavirus disease (COVID-19) caused by the SARS-CoV-2  virus. This Fact Sheet contains information to help you understand the risks and benefits of taking the PAXLOVID you have received or may receive. The U.S. Food and Drug Administration (FDA) has issued an Emergency Use Authorization (EUA) to make PAXLOVID available during the COVID-19 pandemic (for more details about an EUA please see "What is an Emergency Use Authorization?" at the end of this document). PAXLOVID is not an FDA-approved medicine in the [place]. Read this Fact Sheet for information about PAXLOVID. Talk to your healthcare provider about your options or if you have any questions. It is your choice to take PAXLOVID.  What is COVID-19? COVID-19 is caused by a virus called a coronavirus. You can get COVID-19 through close contact with another person who has the virus. COVID-19 illnesses have ranged from very mild-to-severe, including illness resulting in death. While information so far suggests that most COVID-19 illness is mild, serious illness can happen and may cause some of your other medical conditions to become worse. Older people and people of all ages with severe, long lasting (chronic) medical conditions like heart disease, lung disease, and diabetes, for example seem to be at higher risk of being hospitalized for COVID-19.  What is PAXLOVID? PAXLOVID is an investigational medicine used to treat mild-to-moderate COVID-19 in adults and children [32 years of age and older weighing at least 39 pounds (39 kg)] with positive results of direct SARS-CoV-2 viral testing, and who are at high risk for progression to severe COVID-19, including hospitalization or death. PAXLOVID is investigational because it is still being studied. There is limited information about the safety and effectiveness of using PAXLOVID to treat people with mild-to-moderate COVID-19.  The FDA has authorized the emergency use of PAXLOVID for the treatment of mild-tomoderate COVID-19 in adults and  children [8 years of age and older weighing at least 76 pounds (16 kg)] with a positive test for the virus that causes COVID-19, and who are at high risk for progression to severe COVID-19, including hospitalization or death, under an EUA. 1 Revised: 14 May 2020   What should I tell my healthcare provider before I take PAXLOVID? Tell your healthcare provider if you: ? Have any allergies ? Have liver or kidney disease ? Are pregnant or plan to become pregnant ? Are breastfeeding a child ? Have any serious illnesses  Tell your healthcare provider about all the medicines you take, including prescription and over-the-counter medicines, vitamins, and herbal supplements. Some medicines may interact with PAXLOVID and may cause serious side effects. Keep a list of your medicines to show your healthcare provider and pharmacist when you get a new medicine.  You can ask your healthcare provider or pharmacist for a list of medicines that interact with PAXLOVID. Do not start taking a new medicine without telling your healthcare provider. Your healthcare provider can tell you if it is safe to take PAXLOVID with other medicines.  Tell your healthcare provider if you are taking combined hormonal contraceptive. PAXLOVID may affect how your birth control pills work. Females  who are able to become pregnant should use another effective alternative form of contraception or an additional barrier method of contraception. Talk to your healthcare provider if you have any questions about contraceptive methods that might be right for you.  How do I take PAXLOVID? ? PAXLOVID consists of 2 medicines: nirmatrelvir and ritonavir. o Take 2 pink tablets of nirmatrelvir with 1 white tablet of ritonavir by mouth 2 times each day (in the morning and in the evening) for 5 days. For each dose, take all 3 tablets at the same time. o If you have kidney disease, talk to your healthcare provider. You may need a  different dose. ? Swallow the tablets whole. Do not chew, break, or crush the tablets. ? Take PAXLOVID with or without food. ? Do not stop taking PAXLOVID without talking to your healthcare provider, even if you feel better. ? If you miss a dose of PAXLOVID within 8 hours of the time it is usually taken, take it as soon as you remember. If you miss a dose by more than 8 hours, skip the missed dose and take the next dose at your regular time. Do not take 2 doses of PAXLOVID at the same time. ? If you take too much PAXLOVID, call your healthcare provider or go to the nearest hospital emergency room right away. ? If you are taking a ritonavir- or cobicistat-containing medicine to treat hepatitis C or Human Immunodeficiency Virus (HIV), you should continue to take your medicine as prescribed by your healthcare provider. 2 Revised: 14 May 2020    Talk to your healthcare provider if you do not feel better or if you feel worse after 5 days.  Who should generally not take PAXLOVID? Do not take PAXLOVID if: ? You are allergic to nirmatrelvir, ritonavir, or any of the ingredients in PAXLOVID. ? You are taking any of the following medicines: o Alfuzosin o Pethidine, propoxyphene o Ranolazine o Amiodarone, dronedarone, flecainide, propafenone, quinidine o Colchicine o Lurasidone, pimozide, clozapine o Dihydroergotamine, ergotamine, methylergonovine o Lovastatin, simvastatin o Sildenafil (Revatio) for pulmonary arterial hypertension (PAH) o Triazolam, oral midazolam o Apalutamide o Carbamazepine, phenobarbital, phenytoin o Rifampin o St. John's Wort (hypericum perforatum) Taking PAXLOVID with these medicines may cause serious or life-threatening side effects or affect how PAXLOVID works.  These are not the only medicines that may cause serious side effects if taken with PAXLOVID. PAXLOVID may increase or decrease the levels of multiple other medicines. It is very important to tell  your healthcare provider about all of the medicines you are taking because additional laboratory tests or changes in the dose of your other medicines may be necessary while you are taking PAXLOVID. Your healthcare provider may also tell you about specific symptoms to watch out for that may indicate that you need to stop or decrease the dose of some of your other medicines.  What are the important possible side effects of PAXLOVID? Possible side effects of PAXLOVID are: ? Allergic Reactions. Allergic reactions can happen in people taking PAXLOVID, even after only 1 dose. Stop taking PAXLOVID and call your healthcare provider right away if you get any of the following symptoms of an allergic reaction: o hives o trouble swallowing or breathing o swelling of the mouth, lips, or face o throat tightness o hoarseness 3 Revised: 14 May 2020  o skin rash ? Liver Problems. Tell your healthcare provider right away if you have any of these signs and symptoms of liver problems: loss of appetite, yellowing  of your skin and the whites of eyes (jaundice), dark-colored urine, pale colored stools and itchy skin, stomach area (abdominal) pain. ? Resistance to HIV Medicines. If you have untreated HIV infection, PAXLOVID may lead to some HIV medicines not working as well in the future. ? Other possible side effects include: o altered sense of taste o diarrhea o high blood pressure o muscle aches These are not all the possible side effects of PAXLOVID. Not many people have taken PAXLOVID. Serious and unexpected side effects may happen. PAXLOVID is still being studied, so it is possible that all of the risks are not known at this time.  What other treatment choices are there? Veklury (remdesivir) is FDA-approved for the treatment of mild-to-moderate [id number]-44 in certain adults and children. Talk with your doctor to see if Marijean Heath is appropriate for you. Like PAXLOVID, FDA may also allow for the  emergency use of other medicines to treat people with COVID-19. Go to https://price.info/ for information on the emergency use of other medicines that are authorized by FDA to treat people with COVID-19. Your healthcare provider may talk with you about clinical trials for which you may be eligible. It is your choice to be treated or not to be treated with PAXLOVID. Should you decide not to receive it or for your child not to receive it, it will not change your standard medical care.  What if I am pregnant or breastfeeding? There is no experience treating pregnant women or breastfeeding mothers with PAXLOVID. For a mother and unborn baby, the benefit of taking PAXLOVID may be greater than the risk from the treatment. If you are pregnant, discuss your options and specific situation with your healthcare provider. It is recommended that you use effective barrier contraception or do not have sexual activity while taking PAXLOVID. If you are breastfeeding, discuss your options and specific situation with your healthcare provider. 4 Revised: 14 May 2020   How do I report side effects with PAXLOVID? Contact your healthcare provider if you have any side effects that bother you or do not go away. Report side effects to FDA MedWatch at SmoothHits.hu or call 1-800-FDA1088 or you can report side effects to [employer]. at the contact information provided below. Website Fax number Telephone number www.pfizersafetyreporting.com [phone] How should I store [place]? Store PAXLOVID tablets at room temperature, between 68?F to 77?F (20?C to 25?C). How can I learn more about COVID-19? ? Ask your healthcare provider. ? Visit https://jacobson-johnson.com/. ? Contact your local or state public health department. What is an Emergency Use Authorization (EUA)? The [place] FDA has made PAXLOVID available under an emergency access mechanism called an Emergency Use Authorization (EUA). The EUA is supported by a Education officer, museum and Human Service (HHS) declaration that circumstances exist to justify the emergency use of drugs and biological products during the COVID-19 pandemic. PAXLOVID for the treatment of mild-to-moderate COVID-19 in adults and children [62 years of age and older weighing at least 66 pounds (31 kg)] with positive results of direct SARS-CoV-2 viral testing, and who are at high risk for progression to severe COVID-19, including hospitalization or death, has not undergone the same type of review as an FDA-approved product. In issuing an EUA under the LOVFI-43 public health emergency, the FDA has determined, among other things, that based on the total amount of scientific evidence available including data from adequate and well-controlled clinical trials, if available, it is reasonable to believe that the product may be effective for diagnosing, treating,  or preventing COVID-19, or a serious or life-threatening disease or condition caused by COVID-19; that the known and potential benefits of the product, when used to diagnose, treat, or prevent such disease or condition, outweigh the known and potential risks of such product; and that there are no adequate, approved, and available alternatives. All of these criteria must be met to allow for the product to be used in the treatment of patients during the COVID-19 pandemic. The EUA for PAXLOVID is in effect for the duration of the COVID-19 declaration justifying emergency use of this product, unless terminated or revoked (after which the products may no longer be used under the EUA). 5 Revised: 14 May 2020     Additional Information For general questions, visit the website or call the telephone number provided below. Website Telephone  number www.COVID19oralRx.com [phone] (1-877-C19-PACK) You can also go to www.pfizermedinfo.com or call [phone] for more information. [id number] Revised: 14 May 2020

## 2020-12-13 ENCOUNTER — Encounter: Payer: Self-pay | Admitting: Family Medicine

## 2020-12-13 ENCOUNTER — Ambulatory Visit: Payer: Self-pay

## 2020-12-13 ENCOUNTER — Ambulatory Visit: Payer: 59 | Admitting: Family Medicine

## 2020-12-13 VITALS — Ht 68.0 in | Wt 170.0 lb

## 2020-12-13 DIAGNOSIS — M7660 Achilles tendinitis, unspecified leg: Secondary | ICD-10-CM

## 2020-12-13 DIAGNOSIS — M6528 Calcific tendinitis, other site: Secondary | ICD-10-CM

## 2020-12-13 MED ORDER — TRIAMCINOLONE ACETONIDE 40 MG/ML IJ SUSP
40.0000 mg | Freq: Once | INTRAMUSCULAR | Status: AC
Start: 2020-12-13 — End: 2020-12-13
  Administered 2020-12-13: 40 mg

## 2020-12-13 NOTE — Patient Instructions (Signed)
Nice to meet you Please try ice  Please try the gel heel cups  Please try the exercises   Please send me a message in MyChart with any questions or updates.  Please see me back in 4 weeks.   --Dr. Raeford Razor

## 2020-12-13 NOTE — Assessment & Plan Note (Signed)
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 -Counseled on home exercise therapy and supportive care. -Heel lifts. -Counseled on gel cups. -Injection today. -Could consider ECSWT, nitro or physical therapy.

## 2020-12-13 NOTE — Progress Notes (Signed)
Johnny Atkins - 50 y.o. male MRN [id number]  Date of birth: 20-Mar-1970  SUBJECTIVE:  Including CC & ROS.  No chief complaint on file.   Johnny Atkins is a 50 y.o. male that is presenting with acute right heel pain.  No injury.  He is in [employer] and runs about 40 miles per week.  Has a history of gout but it feels different in his Achilles.   Review of Systems See HPI   HISTORY: Past Medical, Surgical, Social, and Family History Reviewed & Updated per EMR.   Pertinent Historical Findings include:  Past Medical History:  Diagnosis Date   Arthritis    Primarily knees, mild osteo   Colon cancer screening    pt chose cologuard 09/2019   Gout    2 attacks in right MTP joint around 17-May-2003; 2 more flares in foot/toe May 17, 2011.  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 Barbaraann Barthel)   Seasonal allergies    Wrist pain    For 2-3 years in his 64s.  Extensive multidisciplinary w/u at [facility] and [facility] revealed NO ABNORMALITY.  Improved with ibuprofen and essentially has spontaneously resolved.    History reviewed. No pertinent surgical history.  Family History  Problem Relation Age of Onset   Arthritis Mother        osteo (no gout)   Arthritis Father        osteo (no gout)   Colon polyps Neg Hx    Colon cancer Neg Hx    Esophageal cancer Neg Hx    Rectal cancer Neg Hx    Stomach cancer Neg Hx     Social History   Socioeconomic History   Marital status: Married    Spouse name: Not on file   Number of children: 2   Years of education: Not on file   Highest education level: Not on file  Occupational History   Not on file  Tobacco Use   Smoking status: Former    Types: Pipe, Cigars    Quit date: 2013/05/16    Years since quitting: 7.7   Smokeless tobacco: Never   Vaping Use   Vaping Use: Never used  Substance and Sexual Activity   Alcohol use: Yes    Alcohol/week: 4.0 standard drinks    Types: 4 Standard drinks or equivalent per week   Drug use: No   Sexual activity: Not on file  Other Topics Concern   Not on file  Social History Narrative   Married, 2 daughters (70 y/o and 25 y/o).   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   Regular Exercise: 1-2 days per week (elliptical machine 30 min).  He is a vegetarian (tends to eat lots of lentils, bread, rice).     No cigarettes but occasional cigar/pipe.  Two alcohol drinks per week (beer or whisky).   Born in [place], moved to [place] in 1994/05/17.  He is the youngest of 5 siblings.  All siblings healthy but one died in 17-May-1998 of a "stomach problem" rather suddenly.   ?BCG given in [place]?  Positive PPD here in 05-17-2003, CXR neg--no treatment.         Social Determinants of Health  Financial Resource Strain: Not on file  Food Insecurity: Not on file  Transportation Needs: Not on file  Physical Activity: Not on file  Stress: Not on file  Social Connections: Not on file  Intimate Partner Violence: Not on file     PHYSICAL EXAM:  VS: Ht 5\' 8"  (1.727 m)   Wt 170 lb (77.1 kg)   BMI 25.85 kg/m  Physical Exam Gen: NAD, alert, cooperative with exam, well-appearing   Limited ultrasound: Right Achilles:  Normal appearing mid belly of the Achilles. No bursa appreciated. Calcific changes at the insertion with mild hyperemia  Summary: Calcific tendinitis  Ultrasound and interpretation by Clearance Coots, MD   Aspiration/Injection Procedure Note Johnny Atkins September 19, 1970  Procedure: Injection Indications: Right Achilles pain  Procedure Details Consent: Risks of procedure as well as the alternatives and risks of each were explained to the (patient/caregiver).  Consent for procedure obtained. Time Out: Verified patient identification, verified procedure,  site/side was marked, verified correct patient position, special equipment/implants available, medications/allergies/relevent history reviewed, required imaging and test results available.  Performed.  The area was cleaned with iodine and alcohol swabs.    The right Achilles tendon was injected using 1 cc's of 40 mg Depo-Medrol and 2 cc's of 0.25% bupivacaine with a 25 1 1/2" needle.  Ultrasound was used. Images were obtained in long views showing the injection.     A sterile dressing was applied.  Patient did tolerate procedure well.     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 -Counseled on home exercise therapy and supportive care. -Heel lifts. -Counseled on gel cups. -Injection today. -Could consider ECSWT, nitro or physical therapy.

## 2020-12-14 ENCOUNTER — Encounter: Payer: Self-pay | Admitting: Family Medicine

## 2020-12-14 ENCOUNTER — Other Ambulatory Visit: Payer: Self-pay | Admitting: Family Medicine

## 2020-12-14 MED ORDER — MELOXICAM 7.5 MG PO TABS: 7.5000 mg | ORAL_TABLET | Freq: Two times a day (BID) | ORAL | 1 refills | Status: DC | PRN

## 2020-12-17 ENCOUNTER — Ambulatory Visit: Payer: Self-pay

## 2020-12-17 ENCOUNTER — Ambulatory Visit: Payer: 59 | Admitting: Family Medicine

## 2020-12-17 ENCOUNTER — Encounter: Payer: Self-pay | Admitting: Family Medicine

## 2020-12-17 ENCOUNTER — Ambulatory Visit (HOSPITAL_BASED_OUTPATIENT_CLINIC_OR_DEPARTMENT_OTHER)
Admission: RE | Admit: 2020-12-17 | Discharge: 2020-12-17 | Disposition: A | Discharge status: Home / Self Care | Payer: 59 | Source: Ambulatory Visit | Attending: Family Medicine | Admitting: Family Medicine

## 2020-12-17 ENCOUNTER — Other Ambulatory Visit: Payer: Self-pay

## 2020-12-17 VITALS — BP 128/90 | Ht 68.0 in | Wt 170.0 lb

## 2020-12-17 DIAGNOSIS — M84374D Stress fracture, right foot, subsequent encounter for fracture with routine healing: Secondary | ICD-10-CM | POA: Insufficient documentation

## 2020-12-17 DIAGNOSIS — M84374A Stress fracture, right foot, initial encounter for fracture: Secondary | ICD-10-CM | POA: Insufficient documentation

## 2020-12-17 MED ORDER — HYDROCODONE-ACETAMINOPHEN 5-325 MG PO TABS
1.0000 | ORAL_TABLET | Freq: Three times a day (TID) | ORAL | 0 refills | Status: DC | PRN
Start: 2020-12-17 — End: 2021-02-09

## 2020-12-17 NOTE — Assessment & Plan Note (Signed)
Having significant worsening of his calcaneal pain with weightbearing.  No improvement with medications and injection.  Symptoms seem most consistent with a fracture given the severity of this pain.  He is not ultramarathoner so likely cumulative effect. -Counseled on home exercise therapy and supportive care. -Norco. -X-rays. -MRI of the hindfoot to evaluate for stress fracture.

## 2020-12-17 NOTE — Patient Instructions (Signed)
Good to see you I will call with the xray results.  Please call [phone] to schedule the MRI   Please send me a message in MyChart with any questions or updates.  We will setup a virtual visit once the results are back.   --Dr. Raeford Razor

## 2020-12-17 NOTE — Progress Notes (Signed)
May 16, 2150  Johnny Atkins - 50 y.o. male MRN [id number]  Date of birth: 12-06-1970  SUBJECTIVE:  Including CC & ROS.  No chief complaint on file.   Johnny Atkins is a 50 y.o. male that is presenting with acute worsening of his right heel pain.  Has tried offloading the area as well as an injection and pain is still severely occurring at the calcaneus.   Review of Systems See HPI   HISTORY: Past Medical, Surgical, Social, and Family History Reviewed & Updated per EMR.   Pertinent Historical Findings include:  Past Medical History:  Diagnosis Date   Arthritis    Primarily knees, mild osteo   Colon cancer screening    pt chose cologuard 09/2019   Gout    2 attacks in right MTP joint around 2003/05/16; 2 more flares in foot/toe 16-May-2011.  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 Barbaraann Barthel)   Seasonal allergies    Wrist pain    For 2-3 years in his 24s.  Extensive multidisciplinary [facility]/u at [facility] and [facility] revealed NO ABNORMALITY.  Improved with ibuprofen and essentially has spontaneously resolved.    History reviewed. No pertinent surgical history.  Family History  Problem Relation Age of Onset   Arthritis Mother        osteo (no gout)   Arthritis Father        osteo (no gout)   Colon polyps Neg Hx    Colon cancer Neg Hx    Esophageal cancer Neg Hx    Rectal cancer Neg Hx    Stomach cancer Neg Hx     Social History   Socioeconomic History   Marital status: Married    Spouse name: Not on file   Number of children: 2   Years of education: Not on file   Highest education level: Not on file  Occupational History   Not on file  Tobacco Use   Smoking status: Former    Types: Pipe, Cigars    Quit date: 05-15-2013    Years since quitting: 7.8   Smokeless tobacco: Never   Vaping Use   Vaping Use: Never used  Substance and Sexual Activity   Alcohol use: Yes    Alcohol/week: 4.0 standard drinks    Types: 4 Standard drinks or equivalent per week   Drug use: No   Sexual activity: Not on file  Other Topics Concern   Not on file  Social History Narrative   Married, 2 daughters (8 y/o and 71 y/o).   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   Regular Exercise: 1-2 days per week (elliptical machine 30 min).  He is a vegetarian (tends to eat lots of lentils, bread, rice).     No cigarettes but occasional cigar/pipe.  Two alcohol drinks per week (beer or whisky).   Born in [place], moved to [place] in 05/16/94.  He is the youngest of 5 siblings.  All siblings healthy but one died in 05/16/1998 of a "stomach problem" rather suddenly.   ?BCG given in [place]?  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         Social Determinants of Radio broadcast assistant  Strain: Not on file  Food Insecurity: Not on file  Transportation Needs: Not on file  Physical Activity: Not on file  Stress: Not on file  Social Connections: Not on file  Intimate Partner Violence: Not on file     PHYSICAL EXAM:  VS: BP 128/90 (BP Location: Left Arm, Patient Position: Sitting)   Ht 5\' 8"  (1.727 m)   Wt 170 lb (77.1 kg)   BMI 25.85 kg/m  Physical Exam Gen: NAD, alert, cooperative with exam, well-appearing   Limited ultrasound: Right Achilles and heel:  Mild retrocalcaneal bursitis. No changes of the Achilles tendon. Calcific changes at the insertion  Summary: Calcific Achilles tendon changes  Ultrasound and interpretation by Clearance Coots, MD     ASSESSMENT & PLAN:   Stress fracture of right foot Having significant worsening of his calcaneal pain with weightbearing.  No improvement with medications and injection.  Symptoms seem most consistent with a fracture given the severity of this pain.  He is not ultramarathoner so likely  cumulative effect. -Counseled on home exercise therapy and supportive care. -Norco. -X-rays. -MRI of the hindfoot to evaluate for stress fracture.

## 2020-12-21 ENCOUNTER — Telehealth: Payer: Self-pay | Admitting: Family Medicine

## 2020-12-21 NOTE — Telephone Encounter (Signed)
Left VM for patient. If he calls back please have him speak with a nurse/CMA and inform that his xrays are normal. Awaiting MRI.   If any questions then please take the best time and phone number to call and I will try to call him back.   Rosemarie Ax, MD [facility] 12/21/2020, 8:05 AM

## 2020-12-25 ENCOUNTER — Other Ambulatory Visit: Payer: Self-pay | Admitting: Family Medicine

## 2020-12-27 ENCOUNTER — Encounter: Payer: Self-pay | Admitting: Family Medicine

## 2020-12-30 ENCOUNTER — Other Ambulatory Visit: Payer: 59

## 2021-01-10 ENCOUNTER — Telehealth: Payer: Self-pay | Admitting: Family Medicine

## 2021-01-10 NOTE — Telephone Encounter (Signed)
Pt informed via MyChart message MRI right ankle is authorized.  I provided him with the number to call [facility] to schedule his MRI.   Auth #: [id number] valid 01/07/21 until 04/07/21.

## 2021-01-10 NOTE — Telephone Encounter (Signed)
Patient called ask if Friday's appt needed since MRI denied--Per Med Asst advised him we are working on an Appeal to [facility] for his denied MRI and are waiting on a response.

## 2021-01-14 ENCOUNTER — Ambulatory Visit: Payer: 59 | Admitting: Family Medicine

## 2021-02-07 ENCOUNTER — Other Ambulatory Visit: Payer: 59

## 2021-02-09 ENCOUNTER — Other Ambulatory Visit: Payer: Self-pay

## 2021-02-09 ENCOUNTER — Encounter: Payer: Self-pay | Admitting: Family Medicine

## 2021-02-09 ENCOUNTER — Ambulatory Visit: Payer: 59 | Admitting: Family Medicine

## 2021-02-09 VITALS — BP 114/80 | HR 78 | Temp 97.7°F | Ht 68.0 in | Wt 179.2 lb

## 2021-02-09 DIAGNOSIS — J189 Pneumonia, unspecified organism: Secondary | ICD-10-CM

## 2021-02-09 DIAGNOSIS — J208 Acute bronchitis due to other specified organisms: Secondary | ICD-10-CM | POA: Diagnosis not present

## 2021-02-09 MED ORDER — DOXYCYCLINE HYCLATE 100 MG PO TABS: 100.0000 mg | ORAL_TABLET | Freq: Two times a day (BID) | ORAL | 0 refills | Status: DC

## 2021-02-09 MED ORDER — HYDROCODONE BIT-HOMATROP MBR 5-1.5 MG/5ML PO SOLN: 5.0000 mL | Freq: Three times a day (TID) | ORAL | 0 refills | Status: DC | PRN

## 2021-02-09 NOTE — Progress Notes (Signed)
Maresha Anastos T. Nettie Wyffels, MD, [place] at [facility] [place], [address]  Phone: [phone]   FAX: [phone]  Johnny Atkins - 50 y.o. male   MRN [id number]   Date of Birth: 06/11/1970  Date: 02/09/2021   PCP: Tammi Sou, MD   Referral: Tammi Sou, MD  Chief Complaint  Patient presents with   Cough    X 10 days-Negative Home Covid test yesterday    This visit occurred during the SARS-CoV-2 public health emergency.  Safety protocols were in place, including screening questions prior to the visit, additional usage of staff PPE, and extensive cleaning of exam room while observing appropriate contact time as indicated for disinfecting solutions.   Subjective:   Johnny Atkins is a 50 y.o. very pleasant male patient with Body mass index is 27.25 kg/m. who presents with the following:  The patient flew to [place] for a wedding, and during that travel he did developed a sickness.  He had an itchy throat, he started to cough, and generally did not feel well.  He was eating and drinking okay, and he did not become exceptionally ill.  He did have a somewhat sore and scratchy throat.  He has taken multiple COVID test along the way, he did bring some to him in [place].  Thinks he is gotten a little bit worse since he has gotten back to [place], and his cough is progressed.  Review of Systems is noted in the HPI, as appropriate  Objective:   BP 114/80    Pulse 78    Temp 97.7 F (36.5 C) (Temporal)    Ht 5\' 8"  (1.727 m)    Wt 179 lb 4 oz (81.3 kg)    SpO2 97%    BMI 27.25 kg/m    Gen: WDWN, cooperative. Globally Non-toxic HEENT: Normocephalic and atraumatic. Throat clear, w/o exudate, R TM clear, L TM - good landmarks, No fluid present. rhinnorhea. No frontal or maxillary sinus T. MMM NECK: Anterior cervical  LAD is present CV: RRR, No M/G/R, cap refill <2 sec PULM: Breathing comfortably in no respiratory distress. no  wheezing, crackles, but +rhonchi ABD: S,NT,ND,+BS. No HSM. No rebound. MSK: Nml gait   Laboratory and Imaging Data:  Assessment and Plan:     ICD-10-CM   1. Atypical pneumonia  J18.9     2. Acute bronchitis due to other specified organisms  J20.8      Greater than 10 days of symptoms with rhonchi on exam.  Likely initial viral infection.  Possibly could have gotten a second 1 while in route to [place] on a very long flight with multiple connections.  Nevertheless given his exam, I think it is prudent to cover him with some antibiotics.  Meds ordered this encounter  Medications   doxycycline (VIBRA-TABS) 100 MG tablet    Sig: Take 1 tablet (100 mg total) by mouth 2 (two) times daily.    Dispense:  20 tablet    Refill:  0   HYDROcodone bit-homatropine (HYCODAN) 5-1.5 MG/5ML syrup    Sig: Take 5 mLs by mouth every 8 (eight) hours as needed for cough.    Dispense:  120 mL    Refill:  0   Medications Discontinued During This Encounter  Medication Reason   HYDROcodone-acetaminophen (NORCO/VICODIN) 5-325 MG tablet No longer needed (for PRN medications)   meloxicam (MOBIC) 7.5 MG tablet Completed Course   No orders of the defined  types were placed in this encounter.   Follow-up: No follow-ups on file.  Dragon [employer] One speech-to-text software was used for transcription in this dictation.  Possible transcriptional errors can occur using Editor, commissioning.   Signed,  Maud Deed. Pleasant Britz, MD   Outpatient Encounter Medications as of 02/09/2021  Medication Sig   albuterol (PROAIR HFA) 108 (90 Base) MCG/ACT inhaler Inhale 2 puffs into the lungs every 6 (six) hours as needed for wheezing or shortness of breath.   allopurinol (ZYLOPRIM) 300 MG tablet TAKE 1 AND 1/2 TABLETS BY MOUTH EVERY DAY   benzonatate (TESSALON) 200 MG capsule Take 1 capsule (200 mg total) by mouth 2 (two) times daily as needed for cough.   doxycycline (VIBRA-TABS) 100 MG tablet Take 1 tablet (100 mg total) by  mouth 2 (two) times daily.   HYDROcodone bit-homatropine (HYCODAN) 5-1.5 MG/5ML syrup Take 5 mLs by mouth every 8 (eight) hours as needed for cough.   [DISCONTINUED] HYDROcodone-acetaminophen (NORCO/VICODIN) 5-325 MG tablet Take 1 tablet by mouth every 8 (eight) hours as needed.   [DISCONTINUED] meloxicam (MOBIC) 7.5 MG tablet Take 1 tablet (7.5 mg total) by mouth 2 (two) times daily as needed.   No facility-administered encounter medications on file as of 02/09/2021.

## 2021-02-11 ENCOUNTER — Encounter: Payer: Self-pay | Admitting: Family Medicine

## 2021-02-11 MED ORDER — LEVOFLOXACIN 500 MG PO TABS: 500.0000 mg | ORAL_TABLET | Freq: Every day | ORAL | 0 refills | Status: DC

## 2021-07-13 ENCOUNTER — Ambulatory Visit: Payer: 59 | Admitting: Family Medicine

## 2021-07-13 ENCOUNTER — Encounter: Payer: Self-pay | Admitting: Family Medicine

## 2021-07-13 VITALS — BP 124/73 | HR 73 | Temp 98.0°F | Ht 68.0 in | Wt 182.2 lb

## 2021-07-13 DIAGNOSIS — R000 Tachycardia, unspecified: Secondary | ICD-10-CM

## 2021-07-13 MED ORDER — ALLOPURINOL 300 MG PO TABS: 450.0000 mg | ORAL_TABLET | Freq: Every day | ORAL | 1 refills | Status: DC

## 2021-07-13 NOTE — Progress Notes (Signed)
OFFICE VISIT ? ?07/13/2021 ? ?CC:  ?Chief Complaint  ?Patient presents with  ? elevated heart rate  ?  Recently started observing 1 month; regularly jogs. Average 150-155.  ? ?Patient is a 51 y.o. male who presents for concern of elevated heart rate. ? ?HPI: ?Patient has been an avid runner for a long time.  Got a heart rate monitoring device recently. ?Has been taking note of his heart rate when he runs.  When he runs at mild to moderate pace it goes up to 140 range.  When he runs up lots of inclines he gets up near 170.  He feels no palpitations, no dizziness, no chest pain, no unusual shortness of breath/dyspnea. ?When asked if he would be concerned if he had not looked at the monitoring device he says no. ?A friend of his who is younger than him recently had an MI and had not been having any problems prior. ?He seeks reassurance today. ? ? ?Past Medical History:  ?Diagnosis Date  ? Arthritis   ? Primarily knees, mild osteo  ? Colon cancer screening   ? pt chose cologuard 09/2019  ? Gout   ? 2 attacks in right MTP joint around 2005; 2 more flares in foot/toe 2013.  Started allopurinol 07/2019  ? IFG (impaired fasting glucose)   ? Fasting gluc 101-107  ? Low testosterone   ? Mild intermittent asthma   ? Overweight (BMI 25.0-29.9)   ? 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 Right shoulder pain 07/2015  ? Right AC synovitis (Dr. Barbaraann Barthel)  ? Seasonal allergies   ? Wrist pain   ? For 2-3 years in his 49s.  Extensive multidisciplinary w/u at [facility] and [facility] revealed NO ABNORMALITY.  Improved with ibuprofen and essentially has spontaneously resolved.  ? ? ?Past Surgical History:  ?Procedure Laterality Date  ? COLONOSCOPY W/ POLYPECTOMY  09/2020  ? 2022 nonadenomatous  ? ? ?Outpatient Medications Prior to Visit  ?Medication Sig Dispense Refill  ? albuterol (PROAIR HFA) 108 (90 Base) MCG/ACT inhaler Inhale 2 puffs into the lungs every 6 (six) hours as needed for wheezing or shortness of breath. (Patient not taking: Reported  on 07/13/2021) 1 each 0  ? allopurinol (ZYLOPRIM) 300 MG tablet TAKE 1 AND 1/2 TABLETS BY MOUTH EVERY DAY 135 tablet 1  ? benzonatate (TESSALON) 200 MG capsule Take 1 capsule (200 mg total) by mouth 2 (two) times daily as needed for cough. (Patient not taking: Reported on 07/13/2021) 20 capsule 0  ? doxycycline (VIBRA-TABS) 100 MG tablet Take 1 tablet (100 mg total) by mouth 2 (two) times daily. (Patient not taking: Reported on 07/13/2021) 20 tablet 0  ? HYDROcodone bit-homatropine (HYCODAN) 5-1.5 MG/5ML syrup Take 5 mLs by mouth every 8 (eight) hours as needed for cough. (Patient not taking: Reported on 07/13/2021) 120 mL 0  ? levofloxacin (LEVAQUIN) 500 MG tablet Take 1 tablet (500 mg total) by mouth daily. (Patient not taking: Reported on 07/13/2021) 7 tablet 0  ? ?No facility-administered medications prior to visit.  ? ? ?No Known Allergies ? ?ROS ?As per HPI ? ?PE: ? ?  07/13/2021  ?  1:32 PM 02/09/2021  ?  3:55 PM 12/17/2020  ? 10:48 AM  ?Vitals with BMI  ?Height '5\' 8"'$  '5\' 8"'$  '5\' 8"'$   ?Weight 182 lbs 3 oz 179 lbs 4 oz 170 lbs  ?BMI 27.71 27.26 25.85  ?Systolic 323 557 322  ?Diastolic 73 80 90  ?Pulse 73 78   ? ? ?Physical  Exam ? ?Gen: Alert, well appearing.  Patient is oriented to person, place, time, and situation. ?AFFECT: pleasant, lucid thought and speech. ?No further exam today. ? ?LABS:  ?Last CBC ?Lab Results  ?Component Value Date  ? WBC 5.1 08/06/2020  ? HGB 14.3 08/06/2020  ? HCT 43.3 08/06/2020  ? MCV 86.6 08/06/2020  ? MCH 27.8 11/23/2017  ? RDW 13.3 08/06/2020  ? PLT 156.0 08/06/2020  ? ?Last metabolic panel ?Lab Results  ?Component Value Date  ? GLUCOSE 93 08/06/2020  ? NA 140 08/06/2020  ? K 4.7 08/06/2020  ? CL 105 08/06/2020  ? CO2 24 08/06/2020  ? BUN 18 08/06/2020  ? CREATININE 1.11 08/06/2020  ? CALCIUM 9.8 08/06/2020  ? PROT 7.0 08/06/2020  ? ALBUMIN 4.8 08/06/2020  ? BILITOT 1.2 08/06/2020  ? ALKPHOS 66 08/06/2020  ? AST 16 08/06/2020  ? ALT 14 08/06/2020  ? ?Last thyroid functions ?Lab Results   ?Component Value Date  ? TSH 2.55 08/06/2020  ? ?IMPRESSION AND PLAN: ? ?Tachycardia. ?Patient describes normal maximal heart rate with vigorous exercise. ?He is asymptomatic.  He is in excellent physical shape, runs long distances regularly without problem. ?Tried to reassure him today but he is still quite apprehensive ?He requested a test to try to confirm that everything is okay. ?I recommended cardiology referral as next best step. ? ?An After Visit Summary was printed and given to the patient. ? ?FOLLOW UP: Return for as needed. ? ?Signed:  Crissie Sickles, MD           07/13/2021 ? ?

## 2021-08-01 ENCOUNTER — Encounter: Payer: Self-pay | Admitting: Family Medicine

## 2021-08-01 ENCOUNTER — Ambulatory Visit: Payer: 59 | Admitting: Family Medicine

## 2021-08-01 VITALS — BP 116/75 | HR 81 | Temp 97.6°F | Ht 68.0 in | Wt 178.0 lb

## 2021-08-01 DIAGNOSIS — J209 Acute bronchitis, unspecified: Secondary | ICD-10-CM | POA: Diagnosis not present

## 2021-08-01 DIAGNOSIS — J069 Acute upper respiratory infection, unspecified: Secondary | ICD-10-CM

## 2021-08-01 DIAGNOSIS — R5081 Fever presenting with conditions classified elsewhere: Secondary | ICD-10-CM

## 2021-08-01 LAB — POC COVID19 BINAXNOW: SARS Coronavirus 2 Ag: NEGATIVE

## 2021-08-01 MED ORDER — DOXYCYCLINE HYCLATE 100 MG PO CAPS: 100.0000 mg | ORAL_CAPSULE | Freq: Two times a day (BID) | ORAL | 0 refills | Status: DC

## 2021-08-01 NOTE — Progress Notes (Signed)
OFFICE VISIT  08/01/2021  CC:  Chief Complaint  Patient presents with   Cough    Productive; brown phlegm. Started Wed, normal cold & cough sxs. Thurs/Fri, runny nose but has since stopped. Sat/Sun started getting worse, ran a fever yesterday (102). Used Tylenol and robitussin cold & cough meds.    Patient is a 51 y.o. male who presents for cough.  HPI: Onset 6 days ago, nasal congestion/runny nose, some postnasal drip with sore throat, some headache. A couple days into the illness he did a home COVID test and it was negative. The URI symptoms seem to have improved but his cough has worsened and he developed a fever over a couple days ago up to 102.  Cough hurts his chest.  No shortness of breath or wheezing.  ROS as above, plus--> no fevers, no CP, no SOB, no wheezing, no cough, no dizziness, no HAs, no rashes, no melena/hematochezia.  No polyuria or polydipsia.  No myalgias or arthralgias.  No focal weakness, paresthesias, or tremors.  No acute vision or hearing abnormalities.  No dysuria or unusual/new urinary urgency or frequency.  No recent changes in lower legs. No n/v/d or abd pain.  No palpitations.    Past Medical History:  Diagnosis Date   Arthritis    Primarily knees, mild osteo   Colon cancer screening    pt chose cologuard 09/2019   Gout    2 attacks in right MTP joint around 2005; 2 more flares in foot/toe 2013.  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 Barbaraann Barthel)   Seasonal allergies    Wrist pain    For 2-3 years in his 60s.  Extensive multidisciplinary w/u at [facility] and [facility] revealed NO ABNORMALITY.  Improved with ibuprofen and essentially has spontaneously resolved.    Past Surgical History:  Procedure Laterality Date   COLONOSCOPY W/ POLYPECTOMY   09/2020   2022 nonadenomatous    Outpatient Medications Prior to Visit  Medication Sig Dispense Refill   allopurinol (ZYLOPRIM) 300 MG tablet Take 1.5 tablets (450 mg total) by mouth daily. 135 tablet 1   albuterol (PROAIR HFA) 108 (90 Base) MCG/ACT inhaler Inhale 2 puffs into the lungs every 6 (six) hours as needed for wheezing or shortness of breath. (Patient not taking: Reported on 07/13/2021) 1 each 0   No facility-administered medications prior to visit.    No Known Allergies  ROS As per HPI  PE:    08/01/2021    9:38 AM 07/13/2021    1:32 PM 02/09/2021    3:55 PM  Vitals with BMI  Height '5\' 8"'$  '5\' 8"'$  '5\' 8"'$   Weight 178 lbs 182 lbs 3 oz 179 lbs 4 oz  BMI 27.07 65.78 46.96  Systolic 295 284 132  Diastolic 75 73 80  Pulse 81 73 78  BP 116/75, HR 81  Physical Exam  Gen: Alert, well appearing.  Patient is oriented to person, place, time, and situation. AFFECT: pleasant, lucid thought and speech. GMW:NUUV: no injection, icteris, swelling, or exudate.  EOMI, PERRLA. Mouth: lips without lesion/swelling.  Oral mucosa pink and moist. Oropharynx without erythema, exudate, or swelling.  Neck: small, soft, mobile submand LN's, R>L. Nontender. CV: RRR, no m/r/g.   LUNGS: CTA bilat, nonlabored  resps, good aeration in all lung fields. EXT: no clubbing or cyanosis.  no edema.   LABS:  Last metabolic panel Lab Results  Component Value Date   GLUCOSE 93 08/06/2020   NA 140 08/06/2020   K 4.7 08/06/2020   CL 105 08/06/2020   CO2 24 08/06/2020   BUN 18 08/06/2020   CREATININE 1.11 08/06/2020   CALCIUM 9.8 08/06/2020   PROT 7.0 08/06/2020   ALBUMIN 4.8 08/06/2020   BILITOT 1.2 08/06/2020   ALKPHOS 66 08/06/2020   AST 16 08/06/2020   ALT 14 08/06/2020   IMPRESSION AND PLAN:  URI with cough/congestion--> acute bronchitis with fever. Covid test here today: NEG Through shared decision making process today I decided to do a course of doxycycline 100 twice daily x7 days.  An  After Visit Summary was printed and given to the patient.  FOLLOW UP: Return if symptoms worsen or fail to improve.  Signed:  Crissie Sickles, MD           08/01/2021

## 2021-08-02 ENCOUNTER — Encounter: Payer: Self-pay | Admitting: Family Medicine

## 2021-08-02 MED ORDER — HYDROCODONE BIT-HOMATROP MBR 5-1.5 MG/5ML PO SOLN: ORAL | 0 refills | Status: DC

## 2021-08-02 NOTE — Telephone Encounter (Signed)
OK Hycodan suspension eRx'd

## 2021-08-08 ENCOUNTER — Encounter: Payer: Self-pay | Admitting: Family Medicine

## 2021-08-08 ENCOUNTER — Ambulatory Visit (INDEPENDENT_AMBULATORY_CARE_PROVIDER_SITE_OTHER): Payer: 59 | Admitting: Family Medicine

## 2021-08-08 VITALS — BP 130/77 | HR 67 | Temp 98.4°F | Ht 70.5 in | Wt 178.2 lb

## 2021-08-08 DIAGNOSIS — Z125 Encounter for screening for malignant neoplasm of prostate: Secondary | ICD-10-CM | POA: Diagnosis not present

## 2021-08-08 DIAGNOSIS — M1000 Idiopathic gout, unspecified site: Secondary | ICD-10-CM

## 2021-08-08 DIAGNOSIS — Z0001 Encounter for general adult medical examination with abnormal findings: Secondary | ICD-10-CM

## 2021-08-08 DIAGNOSIS — R7301 Impaired fasting glucose: Secondary | ICD-10-CM | POA: Diagnosis not present

## 2021-08-08 DIAGNOSIS — S83412A Sprain of medial collateral ligament of left knee, initial encounter: Secondary | ICD-10-CM

## 2021-08-08 DIAGNOSIS — M25562 Pain in left knee: Secondary | ICD-10-CM

## 2021-08-08 DIAGNOSIS — Z0000 Encounter for general adult medical examination without abnormal findings: Secondary | ICD-10-CM

## 2021-08-08 LAB — LIPID PANEL
Cholesterol: 137 mg/dL (ref 0–200)
HDL: 45.9 mg/dL (ref >39.00)
LDL Cholesterol: 74 mg/dL (ref 0–99)
NonHDL: 91.33
Total CHOL/HDL Ratio: 3
Triglycerides: 89 mg/dL (ref 0.0–149.0)
VLDL: 17.8 mg/dL (ref 0.0–40.0)

## 2021-08-08 LAB — TSH: TSH: 1.8 u[IU]/mL (ref 0.35–5.50)

## 2021-08-08 LAB — COMPREHENSIVE METABOLIC PANEL
ALT: 14 U/L (ref 0–53)
AST: 14 U/L (ref 0–37)
Albumin: 4.1 g/dL (ref 3.5–5.2)
Alkaline Phosphatase: 75 U/L (ref 39–117)
BUN: 11 mg/dL (ref 6–23)
CO2: 25 mEq/L (ref 19–32)
Calcium: 9 mg/dL (ref 8.4–10.5)
Chloride: 108 mEq/L (ref 96–112)
Creatinine, Ser: 0.93 mg/dL (ref 0.40–1.50)
GFR: 94.73 mL/min (ref >60.00)
Glucose, Bld: 94 mg/dL (ref 70–99)
Potassium: 4.4 mEq/L (ref 3.5–5.1)
Sodium: 140 mEq/L (ref 135–145)
Total Bilirubin: 0.4 mg/dL (ref 0.2–1.2)
Total Protein: 6.3 g/dL (ref 6.0–8.3)

## 2021-08-08 LAB — CBC
HCT: 40.7 % (ref 39.0–52.0)
Hemoglobin: 13.2 g/dL (ref 13.0–17.0)
MCHC: 32.4 g/dL (ref 30.0–36.0)
MCV: 85.9 fl (ref 78.0–100.0)
Platelets: 189 10*3/uL (ref 150.0–400.0)
RBC: 4.74 Mil/uL (ref 4.22–5.81)
RDW: 13.1 % (ref 11.5–15.5)
WBC: 7.2 10*3/uL (ref 4.0–10.5)

## 2021-08-08 LAB — HEMOGLOBIN A1C: Hgb A1c MFr Bld: 6 % (ref 4.6–6.5)

## 2021-08-08 LAB — PSA: PSA: 0.49 ng/mL (ref 0.10–4.00)

## 2021-08-08 LAB — URIC ACID: Uric Acid, Serum: 7.4 mg/dL (ref 4.0–7.8)

## 2021-08-08 MED ORDER — SILDENAFIL CITRATE 50 MG PO TABS: ORAL_TABLET | ORAL | 6 refills | Status: DC

## 2021-08-08 NOTE — Patient Instructions (Addendum)
Exercises for Chronic Knee Pain Chronic knee pain is pain that lasts longer than 3 months. For most people with chronic knee pain, exercise and weight loss is an important part of treatment. Your health care provider may want you to focus on: Strengthening the muscles that support your knee. This can take pressure off your knee and lessen pain. Preventing knee stiffness. Maintaining or increasing how far you can move your knee. Losing weight (if this applies) to take pressure off your knee, decrease your risk for injury, and make it easier for you to exercise. Your health care provider will help you develop an exercise program that matches your needs and physical abilities. Below are simple, low-impact exercises you can do at home. Ask your health care provider or a physical therapist how often you should do your exercise program and how many times to repeat each exercise. General safety tips Follow these safety tips for exercising with chronic knee pain: Get your health care provider's approval before doing any exercises. Start slowly and stop any time an exercise causes pain. Do not exercise if your knee pain is flaring up. Warm up first. Stretching a cold muscle can cause an injury. Do 5-10 minutes of easy movement or light stretching before beginning your exercise routine. Do 5-10 minutes of low-impact activity (like walking or cycling) before starting strengthening exercises. Contact your health care provider any time you have pain during or after exercising. Exercise may cause discomfort but should not be painful. It is normal to be a little stiff or sore after exercising.  Stretching and range-of-motion exercises Front thigh stretch  Stand up straight and support your body by holding on to a chair or resting one hand on a wall. With your legs straight and close together, bend one knee to lift your heel up toward your buttocks. Using one hand for support, grab your ankle with your free  hand. Pull your foot up closer toward your buttocks to feel the stretch in front of your thigh. Hold the stretch for 30 seconds. Repeat __________ times. Complete this exercise __________ times a day. Back thigh stretch  Sit on the floor with your back straight and your legs out straight in front of you. Place the palms of your hands on the floor and slide them toward your feet as you bend at the hip. Try to touch your nose to your knees and feel the stretch in the back of your thighs. Hold for 30 seconds. Repeat __________ times. Complete this exercise __________ times a day. Calf stretch  Stand facing a wall. Place the palms of your hands flat against the wall, arms extended, and lean slightly against the wall. Get into a lunge position with one leg bent at the knee and the other leg stretched out straight behind you. Keep both feet facing the wall and increase the bend in your knee while keeping the heel of the other leg flat on the ground. You should feel the stretch in your calf. Hold for 30 seconds. Repeat __________ times. Complete this exercise __________ times a day. Strengthening exercises Straight leg lift Lie on your back with one knee bent and the other leg out straight. Slowly lift the straight leg without bending the knee. Lift until your foot is about 12 inches (30 cm) off the floor. Hold for 3-5 seconds and slowly lower your leg. Repeat __________ times. Complete this exercise __________ times a day. Single leg dip Stand between two chairs and put both hands on the   backs of the chairs for support. Extend one leg out straight with your body weight resting on the heel of the standing leg. Slowly bend your standing knee to dip your body to the level that is comfortable for you. Hold for 3-5 seconds. Repeat __________ times. Complete this exercise __________ times a day. Hamstring curls Stand straight, knees close together, facing the back of a chair. Hold on to the  back of a chair with both hands. Keep one leg straight. Bend the other knee while bringing the heel up toward the buttock until the knee is bent at a 90-degree angle (right angle). Hold for 3-5 seconds. Repeat __________ times. Complete this exercise __________ times a day. Wall squat Stand straight with your back, hips, and head against a wall. Step forward one foot at a time with your back still against the wall. Your feet should be 2 feet (61 cm) from the wall at shoulder width. Keeping your back, hips, and head against the wall, slide down the wall to as close of a sitting position as you can get. Hold for 5-10 seconds, then slowly slide back up. Repeat __________ times. Complete this exercise __________ times a day. Step-ups Step up with one foot onto a sturdy platform or stool that is about 6 inches (15 cm) high. Face sideways with one foot on the platform and one on the ground. Place all your weight on the platform foot and lift your body off the ground until your knee extends. Let your other leg hang free to the side. Hold for 3-5 seconds then slowly lower your weight down to the floor foot. Repeat __________ times. Complete this exercise __________ times a day. Contact a health care provider if: Your exercise causes pain. Your pain is worse after you exercise. Your pain prevents you from doing your exercises. This information is not intended to replace advice given to you by your health care provider. Make sure you discuss any questions you have with your health care provider. Document Revised: 06/19/2019 Document Reviewed: 02/10/2019 Elsevier Patient Education  Johnny Atkins Maintenance, Male Adopting a healthy lifestyle and getting preventive care are important in promoting health and wellness. Ask your health care provider about: The right schedule for you to have regular tests and exams. Things you can do on your own to prevent diseases and keep yourself  healthy. What should I know about diet, weight, and exercise? Eat a healthy diet  Eat a diet that includes plenty of vegetables, fruits, low-fat dairy products, and lean protein. Do not eat a lot of foods that are high in solid fats, added sugars, or sodium. Maintain a healthy weight Body mass index (BMI) is a measurement that can be used to identify possible weight problems. It estimates body fat based on height and weight. Your health care provider can help determine your BMI and help you achieve or maintain a healthy weight. Get regular exercise Get regular exercise. This is one of the most important things you can do for your health. Most adults should: Exercise for at least 150 minutes each week. The exercise should increase your heart rate and make you sweat (moderate-intensity exercise). Do strengthening exercises at least twice a week. This is in addition to the moderate-intensity exercise. Spend less time sitting. Even light physical activity can be beneficial. Watch cholesterol and blood lipids Have your blood tested for lipids and cholesterol at 52 years of age, then have this test every 5 years. You may need to have  your cholesterol levels checked more often if: Your lipid or cholesterol levels are high. You are older than 52 years of age. You are at high risk for heart disease. What should I know about cancer screening? Many types of cancers can be detected early and may often be prevented. Depending on your health history and family history, you may need to have cancer screening at various ages. This may include screening for: Colorectal cancer. Prostate cancer. Skin cancer. Lung cancer. What should I know about heart disease, diabetes, and high blood pressure? Blood pressure and heart disease High blood pressure causes heart disease and increases the risk of stroke. This is more likely to develop in people who have high blood pressure readings or are overweight. Talk with  your health care provider about your target blood pressure readings. Have your blood pressure checked: Every 3-5 years if you are 24-19 years of age. Every year if you are 61 years old or older. If you are between the ages of 74 and 65 and are a current or former smoker, ask your health care provider if you should have a one-time screening for abdominal aortic aneurysm (AAA). Diabetes Have regular diabetes screenings. This checks your fasting blood sugar level. Have the screening done: Once every three years after age 86 if you are at a normal weight and have a low risk for diabetes. More often and at a younger age if you are overweight or have a high risk for diabetes. What should I know about preventing infection? Hepatitis B If you have a higher risk for hepatitis B, you should be screened for this virus. Talk with your health care provider to find out if you are at risk for hepatitis B infection. Hepatitis C Blood testing is recommended for: Everyone born from [birth year] through 1966. Anyone with known risk factors for hepatitis C. Sexually transmitted infections (STIs) You should be screened each year for STIs, including gonorrhea and chlamydia, if: You are sexually active and are younger than 52 years of age. You are older than 52 years of age and your health care provider tells you that you are at risk for this type of infection. Your sexual activity has changed since you were last screened, and you are at increased risk for chlamydia or gonorrhea. Ask your health care provider if you are at risk. Ask your health care provider about whether you are at high risk for HIV. Your health care provider may recommend a prescription medicine to help prevent HIV infection. If you choose to take medicine to prevent HIV, you should first get tested for HIV. You should then be tested every 3 months for as long as you are taking the medicine. Follow these instructions at home: Alcohol use Do not drink  alcohol if your health care provider tells you not to drink. If you drink alcohol: Limit how much you have to 0-2 drinks a day. Know how much alcohol is in your drink. In the [place] one drink equals one 12 oz bottle of beer (355 mL), one 5 oz glass of wine (148 mL), or one 1 oz glass of hard liquor (44 mL). Lifestyle Do not use any products that contain nicotine or tobacco. These products include cigarettes, chewing tobacco, and vaping devices, such as e-cigarettes. If you need help quitting, ask your health care provider. Do not use street drugs. Do not share needles. Ask your health care provider for help if you need support or information about quitting drugs. General instructions Schedule  regular health, dental, and eye exams. Stay current with your vaccines. Tell your health care provider if: You often feel depressed. You have ever been abused or do not feel safe at home. Summary Adopting a healthy lifestyle and getting preventive care are important in promoting health and wellness. Follow your health care provider's instructions about healthy diet, exercising, and getting tested or screened for diseases. Follow your health care provider's instructions on monitoring your cholesterol and blood pressure. This information is not intended to replace advice given to you by your health care provider. Make sure you discuss any questions you have with your health care provider. Document Revised: 07/05/2020 Document Reviewed: 07/05/2020 Elsevier Patient Education  Johnny Atkins.

## 2021-08-08 NOTE — Progress Notes (Signed)
Office Note 08/08/2021  CC:  Chief Complaint  Patient presents with   Annual Exam    Pt is fasting   HPI:  Patient is a 51 y.o. male who is here for annual health maintenance exam.  Feeling well.  Minimal residual cough from bronchitis recently. Having some mild ED symptoms and requests medication to help.  Libido intact.  About a week ago he started getting pain in the medial aspect of his left knee.  He cut back his running a lot and instead was doing a lot more weight lifting, involving deep squats.  The pain comes at the deepest aspect of squatting.  No swelling.  No locking up.  No giving way. No preceding acute strain   Past Medical History:  Diagnosis Date   Arthritis    Primarily knees, mild osteo   Colon cancer screening    pt chose cologuard 09/2019   Gout    2 attacks in right MTP joint around 2005; 2 more flares in foot/toe 2013.  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 Barbaraann Barthel)   Seasonal allergies    Wrist pain    For 2-3 years in his 31s.  Extensive multidisciplinary w/u at [facility] and [facility] revealed NO ABNORMALITY.  Improved with ibuprofen and essentially has spontaneously resolved.    Past Surgical History:  Procedure Laterality Date   COLONOSCOPY W/ POLYPECTOMY  09/2020   2022 nonadenomatous    Family History  Problem Relation Age of Onset   Arthritis Mother        osteo (no gout)   Arthritis Father        osteo (no gout)   Colon polyps Neg Hx    Colon cancer Neg Hx    Esophageal cancer Neg Hx    Rectal cancer Neg Hx    Stomach cancer Neg Hx     Social History   Socioeconomic History   Marital status: Married    Spouse name: Not on file   Number of children: 2   Years of education: Not on file   Highest education  level: Not on file  Occupational History   Not on file  Tobacco Use   Smoking status: Former    Types: Pipe, Cigars    Quit date: 2015    Years since quitting: 8.4   Smokeless tobacco: Never  Vaping Use   Vaping Use: Never used  Substance and Sexual Activity   Alcohol use: Yes    Alcohol/week: 4.0 standard drinks of alcohol    Types: 4 Standard drinks or equivalent per week   Drug use: No   Sexual activity: Not on file  Other Topics Concern   Not on file  Social History Narrative   Married, 2 daughters (54 y/o and 49 y/o).   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   Regular Exercise: 1-2 days per week (elliptical machine 30 min).  He is a vegetarian (tends to eat lots of lentils, bread, rice).     No cigarettes but occasional cigar/pipe.  Two alcohol drinks per week (beer or whisky).   Born in [place], moved to [place] in 1996.  He is the youngest of 5 siblings.  All  siblings healthy but one died in 1998-06-06 of a "stomach problem" rather suddenly.   ?BCG given in [place]?  Positive PPD here in 06/06/03, CXR neg--no treatment.         Social Determinants of Health   Financial Resource Strain: Not on file  Food Insecurity: Not on file  Transportation Needs: Not on file  Physical Activity: Not on file  Stress: Not on file  Social Connections: Not on file  Intimate Partner Violence: Not on file    Outpatient Medications Prior to Visit  Medication Sig Dispense Refill   allopurinol (ZYLOPRIM) 300 MG tablet Take 1.5 tablets (450 mg total) by mouth daily. 135 tablet 1   albuterol (PROAIR HFA) 108 (90 Base) MCG/ACT inhaler Inhale 2 puffs into the lungs every 6 (six) hours as needed for wheezing or shortness of breath. (Patient not taking: Reported on 07/13/2021) 1 each 0   HYDROcodone bit-homatropine (HYCODAN) 5-1.5 MG/5ML syrup 1-2 tsp po bid prn cough (Patient not taking: Reported on 08/08/2021) 120 mL 0   doxycycline (VIBRAMYCIN) 100 MG capsule Take 1  capsule (100 mg total) by mouth 2 (two) times daily for 7 days. (Patient not taking: Reported on 08/08/2021) 14 capsule 0   No facility-administered medications prior to visit.    No Known Allergies  ROS Review of Systems  Constitutional:  Negative for appetite change, chills, fatigue and fever.  HENT:  Negative for congestion, dental problem, ear pain and sore throat.   Eyes:  Negative for discharge, redness and visual disturbance.  Respiratory:  Positive for cough (gradually resolving, from recent bronchitis illness). Negative for chest tightness, shortness of breath and wheezing.   Cardiovascular:  Negative for chest pain, palpitations and leg swelling.  Gastrointestinal:  Negative for abdominal pain, blood in stool, diarrhea, nausea and vomiting.  Genitourinary:  Negative for difficulty urinating, dysuria, flank pain, frequency, hematuria and urgency.  Musculoskeletal:  Positive for arthralgias (left knee pain medially with deep bending). Negative for back pain, joint swelling, myalgias and neck stiffness.  Skin:  Negative for pallor and rash.  Neurological:  Negative for dizziness, speech difficulty, weakness and headaches.  Hematological:  Negative for adenopathy. Does not bruise/bleed easily.  Psychiatric/Behavioral:  Negative for confusion and sleep disturbance. The patient is not nervous/anxious.     PE;    08/08/2021    9:13 AM 08/01/2021    9:38 AM 07/13/2021    1:32 PM  Vitals with BMI  Height 5' 10.5" '5\' 8"'$  '5\' 8"'$   Weight 178 lbs 3 oz 178 lbs 182 lbs 3 oz  BMI 25.2 97.41 63.84  Systolic 536 468 032  Diastolic 77 75 73  Pulse 67 81 73   Gen: Alert, well appearing.  Patient is oriented to person, place, time, and situation. AFFECT: pleasant, lucid thought and speech. ENT: Ears: EACs clear, normal epithelium.  TMs with good light reflex and landmarks bilaterally.  Eyes: no injection, icteris, swelling, or exudate.  EOMI, PERRLA. Nose: no drainage or turbinate  edema/swelling.  No injection or focal lesion.  Mouth: lips without lesion/swelling.  Oral mucosa pink and moist.  Dentition intact and without obvious caries or gingival swelling.  Oropharynx without erythema, exudate, or swelling.  Neck: supple/nontender.  No LAD, mass, or TM.  Carotid pulses 2+ bilaterally, without bruits. CV: RRR, no m/r/g.   LUNGS: CTA bilat, nonlabored resps, good aeration in all lung fields. ABD: soft, NT, ND, BS normal.  No hepatospenomegaly or mass.  No bruits. EXT: no clubbing, cyanosis, or  edema.  Musculoskeletal: Left knee has no swelling or erythema.  He has mild tenderness around distal aspect of the vastus medialis and proximal aspect of MCL the discomfort to palpation extends a little bit up in the peripatellar area medial aspect.  No joint line tenderness.  No instability.  McMurray's negative.  Otherwise, no joint swelling, erythema, warmth, or tenderness.  ROM of all joints intact. Skin - no sores or suspicious lesions or rashes or color changes  Pertinent labs:  Lab Results  Component Value Date   TSH 2.55 08/06/2020   Lab Results  Component Value Date   WBC 5.1 08/06/2020   HGB 14.3 08/06/2020   HCT 43.3 08/06/2020   MCV 86.6 08/06/2020   PLT 156.0 08/06/2020   Lab Results  Component Value Date   CREATININE 1.11 08/06/2020   BUN 18 08/06/2020   NA 140 08/06/2020   K 4.7 08/06/2020   CL 105 08/06/2020   CO2 24 08/06/2020   Lab Results  Component Value Date   ALT 14 08/06/2020   AST 16 08/06/2020   ALKPHOS 66 08/06/2020   BILITOT 1.2 08/06/2020   Lab Results  Component Value Date   CHOL 152 08/06/2020   Lab Results  Component Value Date   HDL 45.50 08/06/2020   Lab Results  Component Value Date   LDLCALC 92 08/06/2020   Lab Results  Component Value Date   TRIG 72.0 08/06/2020   Lab Results  Component Value Date   CHOLHDL 3 08/06/2020   Lab Results  Component Value Date   PSA 0.54 08/06/2020   PSA 0.53 08/07/2019   Lab  Results  Component Value Date   HGBA1C 5.7 08/06/2020   Lab Results  Component Value Date   ESRSEDRATE 11 07/02/2020   Lab Results  Component Value Date   CRP <1.0 07/02/2020   Lab Results  Component Value Date   LABURIC 4.6 10/30/2019   ASSESSMENT AND PLAN:   1) Health maintenance exam: Reviewed age and gender appropriate health maintenance issues (prudent diet, regular exercise, health risks of tobacco and excessive alcohol, use of seatbelts, fire alarms in home, use of sunscreen).  Also reviewed age and gender appropriate health screening as well as vaccine recommendations. Vaccines: all UTD. Labs: HP, PSA, Hba1c (hx IFG) Prostate ca screening: PSA today Colon ca screening: recall 2032 (pt had non-adenomatous polyp 09/2020)  2) acute left knee pain. Question mild distal quad strain, plus minus MCL sprain.  Less suspicious for meniscal injury. Knee strengthening exercises, emphasized eccentric. Start running just gradually and build up distance and pace. No heavy deep knee squats.  3) gout. He has been without any gout flares for a long.  Now on allopurinol 1.5 of the 300 mg tabs daily.  We will check uric acid level today and if it remains below 6 we will cut back to 1 tab a day.  Resolving bronchitis. Has appt to address his concern of tachycardia 08/24/21 with Dr. Einar Gip.  An After Visit Summary was printed and given to the patient.  FOLLOW UP:  Return in about 1 year (around 08/09/2022) for annual CPE (fasting).  Signed:  Crissie Sickles, MD           08/08/2021

## 2021-08-09 ENCOUNTER — Encounter: Payer: Self-pay | Admitting: Family Medicine

## 2021-08-09 ENCOUNTER — Telehealth: Payer: Self-pay

## 2021-08-09 DIAGNOSIS — R7303 Prediabetes: Secondary | ICD-10-CM

## 2021-08-09 NOTE — Telephone Encounter (Signed)
-----   Message from Tammi Sou, MD sent at 08/09/2021  9:09 AM EDT ----- Uric acid level 7.4.  I recommend he continue his current dose of allopurinol. Hemoglobin A1c was 6%, which is in the mild prediabetes range. This does not necessarily mean that she is going to develop "full blown" diabetes, but he is at higher risk for diabetes. Lifestyle changes can often halt the process. I recommend he minimize sugars (esp any colas that are not diet, and also sweet tea), limit dessert foods, snack foods, fast foods, and restaurant foods. Minimize potatoes, white bread, rice, and pasta. Increase fresh fruits and fresh vegetables. Drink more water. He already has excellent exercise habits. We will repeat his hemoglobin A1c in 6 months, nonfasting lab visit, diagnosis prediabetes. The remainder of all his labs were normal.

## 2021-08-24 ENCOUNTER — Encounter: Payer: Self-pay | Admitting: Cardiology

## 2021-08-24 ENCOUNTER — Ambulatory Visit: Payer: 59 | Admitting: Cardiology

## 2021-08-24 VITALS — BP 136/96 | HR 64 | Temp 98.4°F | Resp 17 | Ht 70.5 in | Wt 179.2 lb

## 2021-08-24 DIAGNOSIS — K219 Gastro-esophageal reflux disease without esophagitis: Secondary | ICD-10-CM

## 2021-08-24 DIAGNOSIS — R000 Tachycardia, unspecified: Secondary | ICD-10-CM

## 2021-08-24 DIAGNOSIS — R0789 Other chest pain: Secondary | ICD-10-CM

## 2021-08-24 DIAGNOSIS — R030 Elevated blood-pressure reading, without diagnosis of hypertension: Secondary | ICD-10-CM

## 2021-08-24 MED ORDER — OMEPRAZOLE 20 MG PO CPDR: 20.0000 mg | DELAYED_RELEASE_CAPSULE | Freq: Every day | ORAL | 0 refills | Status: DC | PRN

## 2021-08-24 NOTE — Progress Notes (Signed)
Primary Physician/Referring:  Tammi Sou, MD  Patient ID: Johnny Atkins, male    DOB: 03-25-1970, 51 y.o.   MRN: [id number]  Chief Complaint  Patient presents with   New Patient (Initial Visit)   Tachycardia   HPI:    Johnny Atkins  is a 51 y.o. [place] male patient with hypertension, smokes cigarettes recreationally about 1 to 2 cigarettes in a month, exercises avidly and runs at least 20 to 25 miles a week, referred to me for evaluation of mild chest discomfort, elevated heart rate with exercise in view of other risk factors including hyperglycemia and [place] heritage.  Patient states that occasionally he gets left arm pain and also left upper chest pain during rest and last only a short period of time.  No other associated symptoms.  He has not had any chest pain with exertion activity per se.  Is also noticed elevated heart rate with exercise and he has been concerned about this as it has been exaggerated recently and has noticed his heart rate to go all the way up to 162 178 bpm with exercise.  No other associated symptoms.  He is accompanied by his wife.  Past Medical History:  Diagnosis Date   Arthritis    Primarily knees, mild osteo   Colon cancer screening    pt chose cologuard 09/2019   Gout    2 attacks in right MTP joint around 2005; 2 more flares in foot/toe 2013.  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   Right shoulder pain 07/2015   Right AC synovitis (Dr. Barbaraann Barthel)   Seasonal allergies    Wrist pain    For 2-3 years in his 74s.  Extensive multidisciplinary w/u at [facility] and [facility] revealed NO ABNORMALITY.  Improved with ibuprofen and essentially has spontaneously resolved.   Past Surgical History:  Procedure Laterality Date   COLONOSCOPY W/ POLYPECTOMY  09/2020   2022  nonadenomatous   Family History  Problem Relation Age of Onset   Arthritis Mother        osteo (no gout)   Arthritis Father        osteo (no gout)   Colon polyps Neg Hx    Colon cancer Neg Hx    Esophageal cancer Neg Hx    Rectal cancer Neg Hx    Stomach cancer Neg Hx     Social History   Tobacco Use   Smoking status: Some Days    Types: Pipe, Cigars    Last attempt to quit: 2015    Years since quitting: 8.4   Smokeless tobacco: Never  Substance Use Topics   Alcohol use: Yes    Alcohol/week: 4.0 standard drinks of alcohol    Types: 4 Standard drinks or equivalent per week    Comment: OCC   Marital Status: Married  ROS  Review of Systems  Cardiovascular:  Positive for chest pain. Negative for dyspnea on exertion and leg swelling.   Objective  Blood pressure (!) 136/96, pulse 64, temperature 98.4 F (36.9 C), temperature source Temporal, resp. rate 17, height 5' 10.5" (1.791 m), weight 179 lb 3.2 oz (81.3 kg), SpO2 98 %. Body mass index is 25.35 kg/m.     08/24/2021   12:17 PM 08/24/2021  11:05 AM 08/08/2021    9:13 AM  Vitals with BMI  Height  5' 10.5" 5' 10.5"  Weight  179 lbs 3 oz 178 lbs 3 oz  BMI  08.67 61.9  Systolic 509 326 712  Diastolic 96 87 77  Pulse  64 67    Physical Exam Neck:     Vascular: No JVD.  Cardiovascular:     Rate and Rhythm: Normal rate and regular rhythm.     Pulses: Intact distal pulses.     Heart sounds: Normal heart sounds. No murmur heard.    No gallop.  Pulmonary:     Effort: Pulmonary effort is normal.     Breath sounds: Normal breath sounds.  Abdominal:     General: Bowel sounds are normal.     Palpations: Abdomen is soft.  Musculoskeletal:     Right lower leg: No edema.     Left lower leg: No edema.     Medications and allergies  No Known Allergies   Medication list after today's encounter   Current Outpatient Medications:    allopurinol (ZYLOPRIM) 300 MG tablet, Take 1.5 tablets (450 mg total) by mouth daily.,  Disp: 135 tablet, Rfl: 1   omeprazole (PRILOSEC) 20 MG capsule, Take 1 capsule (20 mg total) by mouth daily as needed., Disp: 90 capsule, Rfl: 0  Laboratory examination:   Recent Labs    08/08/21 1002  NA 140  K 4.4  CL 108  CO2 25  GLUCOSE 94  BUN 11  CREATININE 0.93  CALCIUM 9.0   estimated creatinine clearance is 97.5 mL/min (by C-G formula based on SCr of 0.93 mg/dL).     Latest Ref Rng & Units 08/08/2021   10:02 AM 08/06/2020    8:37 AM 07/02/2020    9:33 AM  CMP  Glucose 70 - 99 mg/dL 94  93  90   BUN 6 - 23 mg/dL '11  18  21   '$ Creatinine 0.40 - 1.50 mg/dL 0.93  1.11  1.12   Sodium 135 - 145 mEq/L 140  140  141   Potassium 3.5 - 5.1 mEq/L 4.4  4.7  4.5   Chloride 96 - 112 mEq/L 108  105  105   CO2 19 - 32 mEq/L '25  24  26   '$ Calcium 8.4 - 10.5 mg/dL 9.0  9.8  9.6   Total Protein 6.0 - 8.3 g/dL 6.3  7.0  6.9   Total Bilirubin 0.2 - 1.2 mg/dL 0.4  1.2  1.1   Alkaline Phos 39 - 117 U/L 75  66  70   AST 0 - 37 U/L '14  16  16   '$ ALT 0 - 53 U/L '14  14  14       '$ Latest Ref Rng & Units 08/08/2021   10:02 AM 08/06/2020    8:37 AM 07/02/2020    9:33 AM  CBC  WBC 4.0 - 10.5 K/uL 7.2  5.1  4.9   Hemoglobin 13.0 - 17.0 g/dL 13.2  14.3  14.3   Hematocrit 39.0 - 52.0 % 40.7  43.3  42.6   Platelets 150.0 - 400.0 K/uL 189.0  156.0  137.0     Lipid Panel Recent Labs    08/08/21 1002  CHOL 137  TRIG 89.0  LDLCALC 74  VLDL 17.8  HDL 45.90  CHOLHDL 3    HEMOGLOBIN A1C Lab Results  Component Value Date   HGBA1C 6.0 08/08/2021   MPG 114 11/23/2017  TSH Recent Labs    08/08/21 1002  TSH 1.80   Radiology:    Cardiac Studies:   NA  EKG:   EKG 08/24/2021: EKG 08/24/2021: Normal sinus rhythm at rate of 64 bpm, normal axis, incomplete right bundle branch block.  Normal EKG.    Assessment     ICD-10-CM   1. Exercise-induced tachycardia  R00.0 EKG 12-Lead    PCV ECHOCARDIOGRAM COMPLETE    PCV CARDIAC STRESS TEST    CT CARDIAC SCORING ([facility] LOCATIONS ONLY)    2.  Elevated BP without diagnosis of hypertension  R03.0 PCV ECHOCARDIOGRAM COMPLETE    PCV CARDIAC STRESS TEST    CT CARDIAC SCORING ([facility] LOCATIONS ONLY)    3. Musculoskeletal chest pain  R07.89 CT CARDIAC SCORING ([facility] LOCATIONS ONLY)    4. Gastroesophageal reflux disease without esophagitis  K21.9 omeprazole (PRILOSEC) 20 MG capsule       Medications Discontinued During This Encounter  Medication Reason   albuterol (PROAIR HFA) 108 (90 Base) MCG/ACT inhaler No longer needed (for PRN medications)   HYDROcodone bit-homatropine (HYCODAN) 5-1.5 MG/5ML syrup No longer needed (for PRN medications)   sildenafil (VIAGRA) 50 MG tablet No longer needed (for PRN medications)    Meds ordered this encounter  Medications   omeprazole (PRILOSEC) 20 MG capsule    Sig: Take 1 capsule (20 mg total) by mouth daily as needed.    Dispense:  90 capsule    Refill:  0   Orders Placed This Encounter  Procedures   CT CARDIAC SCORING ([facility] LOCATIONS ONLY)    Standing Status:   Future    Standing Expiration Date:   10/24/2021    Order Specific Question:   Preferred imaging location?    Answer:   [facility]   PCV CARDIAC STRESS TEST    Standing Status:   Future    Standing Expiration Date:   10/24/2021   EKG 12-Lead   PCV ECHOCARDIOGRAM COMPLETE    Standing Status:   Future    Standing Expiration Date:   08/25/2022   Recommendations:   Yair OTNIEL HOE is a 51 y.o. [place] male patient with hypertension, smokes cigarettes recreationally about 1 to 2 cigarettes in a month, exercises avidly and runs at least 20 to 25 miles a week, referred to me for evaluation of mild chest discomfort, elevated heart rate with exercise in view of other risk factors including hyperglycemia and [place] heritage.  His chest pain symptoms appear to be most atypical and mostly musculoskeletal.  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  From cardiac standpoint, I have recommended coronary calcium score and an echocardiogram.  I will also perform routine treadmill exercise stress test to evaluate his heart rate response and specifically look for blood pressure response as his blood pressure was markedly elevated.  His blood pressure was well controlled and normal when he had a visit with his PCP.  He may potentially have labile hypertension as well.  Some of the symptoms of dyspnea and also chest pain may be related to GERD, he has been having worsening symptoms of GERD lately.  I prescribed him PPI, omeprazole to be taken on a as needed basis.  I will see him back in 6 to 8 weeks for follow-up.  Adrian Prows, MD, [facility] 08/24/2021, 10:53 PM Office: [phone]

## 2021-08-24 NOTE — Progress Notes (Deleted)
Primary Physician/Referring:  Tammi Sou, MD  Patient ID: Johnny Atkins, male    DOB: 03/23/1970, 51 y.o.   MRN: [id number]  Chief Complaint  Patient presents with   New Patient (Initial Visit)   Tachycardia   HPI:    Johnny Atkins  is a 51 y.o. male with history of hyperglycemia, asthma, tobacco use.   Patient is an avid runner and was evaluated by his PCP in May 2023 with concerns of his heart rate increasing to 170 bpm with maximal exertion. PCP reassured patient however patient requested additional reassure. He is therefore referred by PCP for evaluation of tachycardia and cardiac risk evaluation.   ***  Past Medical History:  Diagnosis Date   Arthritis    Primarily knees, mild osteo   Colon cancer screening    pt chose cologuard 09/2019   Gout    2 attacks in right MTP joint around 2005; 2 more flares in foot/toe 2013.  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   Right shoulder pain 07/2015   Right AC synovitis (Dr. Barbaraann Barthel)   Seasonal allergies    Wrist pain    For 2-3 years in his 58s.  Extensive multidisciplinary w/u at [facility] and [facility] revealed NO ABNORMALITY.  Improved with ibuprofen and essentially has spontaneously resolved.   Past Surgical History:  Procedure Laterality Date   COLONOSCOPY W/ POLYPECTOMY  09/2020   2022 nonadenomatous   Family History  Problem Relation Age of Onset   Arthritis Mother        osteo (no gout)   Arthritis Father        osteo (no gout)   Colon polyps Neg Hx    Colon cancer Neg Hx    Esophageal cancer Neg Hx    Rectal cancer Neg Hx    Stomach cancer Neg Hx     Social History   Tobacco Use   Smoking status: Some Days    Types: Pipe, Cigars    Last attempt to quit: 2015    Years since quitting: 8.4   Smokeless tobacco: Never  Substance Use  Topics   Alcohol use: Yes    Alcohol/week: 4.0 standard drinks of alcohol    Types: 4 Standard drinks or equivalent per week    Comment: OCC   Marital Status: Married   ROS  ***ROS  Objective  Blood pressure (!) 136/96, pulse 64, temperature 98.4 F (36.9 C), temperature source Temporal, resp. rate 17, height 5' 10.5" (1.791 m), weight 179 lb 3.2 oz (81.3 kg), SpO2 98 %.     08/24/2021   12:17 PM 08/24/2021   11:05 AM 08/08/2021    9:13 AM  Vitals with BMI  Height  5' 10.5" 5' 10.5"  Weight  179 lbs 3 oz 178 lbs 3 oz  BMI  71.24 58.0  Systolic 998 338 250  Diastolic 96 87 77  Pulse  64 67      ***Physical Exam  Laboratory examination:   Recent Labs    08/08/21 1002  NA 140  K 4.4  CL 108  CO2 25  GLUCOSE 94  BUN 11  CREATININE 0.93  CALCIUM 9.0   estimated creatinine clearance is 97.5 mL/min (by C-G formula based on SCr of 0.93 mg/dL).  Latest Ref Rng & Units 08/08/2021   10:02 AM 08/06/2020    8:37 AM 07/02/2020    9:33 AM  CMP  Glucose 70 - 99 mg/dL 94  93  90   BUN 6 - 23 mg/dL '11  18  21   '$ Creatinine 0.40 - 1.50 mg/dL 0.93  1.11  1.12   Sodium 135 - 145 mEq/L 140  140  141   Potassium 3.5 - 5.1 mEq/L 4.4  4.7  4.5   Chloride 96 - 112 mEq/L 108  105  105   CO2 19 - 32 mEq/L '25  24  26   '$ Calcium 8.4 - 10.5 mg/dL 9.0  9.8  9.6   Total Protein 6.0 - 8.3 g/dL 6.3  7.0  6.9   Total Bilirubin 0.2 - 1.2 mg/dL 0.4  1.2  1.1   Alkaline Phos 39 - 117 U/L 75  66  70   AST 0 - 37 U/L '14  16  16   '$ ALT 0 - 53 U/L '14  14  14       '$ Latest Ref Rng & Units 08/08/2021   10:02 AM 08/06/2020    8:37 AM 07/02/2020    9:33 AM  CBC  WBC 4.0 - 10.5 K/uL 7.2  5.1  4.9   Hemoglobin 13.0 - 17.0 g/dL 13.2  14.3  14.3   Hematocrit 39.0 - 52.0 % 40.7  43.3  42.6   Platelets 150.0 - 400.0 K/uL 189.0  156.0  137.0     Lipid Panel Recent Labs    08/08/21 1002  CHOL 137  TRIG 89.0  LDLCALC 74  VLDL 17.8  HDL 45.90  CHOLHDL 3    HEMOGLOBIN A1C Lab Results  Component  Value Date   HGBA1C 6.0 08/08/2021   MPG 114 11/23/2017   TSH Recent Labs    08/08/21 1002  TSH 1.80    External labs:  None   Allergies  No Known Allergies   Medications Prior to Visit:   Outpatient Medications Prior to Visit  Medication Sig Dispense Refill   allopurinol (ZYLOPRIM) 300 MG tablet Take 1.5 tablets (450 mg total) by mouth daily. 135 tablet 1   albuterol (PROAIR HFA) 108 (90 Base) MCG/ACT inhaler Inhale 2 puffs into the lungs every 6 (six) hours as needed for wheezing or shortness of breath. 1 each 0   HYDROcodone bit-homatropine (HYCODAN) 5-1.5 MG/5ML syrup 1-2 tsp po bid prn cough 120 mL 0   sildenafil (VIAGRA) 50 MG tablet 1-2 tabs po qd prn.  Take 2mn prior to intercourse 30 tablet 6   No facility-administered medications prior to visit.   Final Medications at End of Visit    Current Meds  Medication Sig   allopurinol (ZYLOPRIM) 300 MG tablet Take 1.5 tablets (450 mg total) by mouth daily.   omeprazole (PRILOSEC) 20 MG capsule Take 1 capsule (20 mg total) by mouth daily as needed.   Radiology:   No results found.  Cardiac Studies:   None   EKG:   ***  Assessment     ICD-10-CM   1. Exercise-induced tachycardia  R00.0 EKG 12-Lead    PCV ECHOCARDIOGRAM COMPLETE    PCV CARDIAC STRESS TEST    CT CARDIAC SCORING ([facility] LOCATIONS ONLY)    2. Elevated BP without diagnosis of hypertension  R03.0 PCV ECHOCARDIOGRAM COMPLETE    PCV CARDIAC STRESS TEST    CT CARDIAC SCORING ([facility] LOCATIONS ONLY)    3. Musculoskeletal chest pain  R07.89 CT CARDIAC SCORING ([facility] LOCATIONS ONLY)    4. Gastroesophageal reflux disease without esophagitis  K21.9 omeprazole (PRILOSEC) 20 MG capsule       Medications Discontinued During This Encounter  Medication Reason   albuterol (PROAIR HFA) 108 (90 Base) MCG/ACT inhaler No longer needed (for PRN medications)   HYDROcodone bit-homatropine (HYCODAN) 5-1.5 MG/5ML syrup No longer needed (for PRN medications)    sildenafil (VIAGRA) 50 MG tablet No longer needed (for PRN medications)    Meds ordered this encounter  Medications   omeprazole (PRILOSEC) 20 MG capsule    Sig: Take 1 capsule (20 mg total) by mouth daily as needed.    Dispense:  90 capsule    Refill:  0    Recommendations:   Johnny Atkins is a 51 y.o. ***  ***

## 2021-08-31 ENCOUNTER — Other Ambulatory Visit: Payer: 59

## 2021-09-02 ENCOUNTER — Ambulatory Visit
Admission: RE | Admit: 2021-09-02 | Discharge: 2021-09-02 | Disposition: A | Discharge status: Home / Self Care | Payer: 59 | Source: Ambulatory Visit | Attending: Cardiology | Admitting: Cardiology

## 2021-09-02 DIAGNOSIS — R030 Elevated blood-pressure reading, without diagnosis of hypertension: Secondary | ICD-10-CM

## 2021-09-02 DIAGNOSIS — R0789 Other chest pain: Secondary | ICD-10-CM

## 2021-09-02 DIAGNOSIS — R000 Tachycardia, unspecified: Secondary | ICD-10-CM

## 2021-09-02 HISTORY — PX: OTHER SURGICAL HISTORY: SHX169

## 2021-09-04 NOTE — Progress Notes (Signed)
Coronary calcium score 09/04/2021: Coronary calcium score of 0.  Ascending and descending thoracic aortic measurements are normal.  No significant extracardiac abnormalities.

## 2021-09-08 ENCOUNTER — Ambulatory Visit: Payer: 59

## 2021-09-08 DIAGNOSIS — R030 Elevated blood-pressure reading, without diagnosis of hypertension: Secondary | ICD-10-CM

## 2021-09-08 DIAGNOSIS — R000 Tachycardia, unspecified: Secondary | ICD-10-CM

## 2021-09-08 HISTORY — PX: OTHER SURGICAL HISTORY: SHX169

## 2021-09-13 ENCOUNTER — Encounter: Payer: Self-pay | Admitting: Family Medicine

## 2021-09-16 ENCOUNTER — Ambulatory Visit: Payer: 59

## 2021-09-16 DIAGNOSIS — R030 Elevated blood-pressure reading, without diagnosis of hypertension: Secondary | ICD-10-CM

## 2021-09-16 DIAGNOSIS — R000 Tachycardia, unspecified: Secondary | ICD-10-CM

## 2021-09-16 HISTORY — PX: TRANSTHORACIC ECHOCARDIOGRAM: SHX275

## 2021-09-19 ENCOUNTER — Encounter: Payer: Self-pay | Admitting: Family Medicine

## 2021-09-26 ENCOUNTER — Encounter: Payer: Self-pay | Admitting: Cardiology

## 2021-09-26 ENCOUNTER — Ambulatory Visit: Payer: 59 | Admitting: Cardiology

## 2021-09-26 VITALS — BP 138/85 | HR 80 | Temp 97.7°F | Resp 16 | Ht 70.0 in | Wt 183.0 lb

## 2021-09-26 DIAGNOSIS — R030 Elevated blood-pressure reading, without diagnosis of hypertension: Secondary | ICD-10-CM

## 2021-09-26 DIAGNOSIS — R000 Tachycardia, unspecified: Secondary | ICD-10-CM

## 2021-09-26 NOTE — Progress Notes (Unsigned)
Primary Physician/Referring:  Tammi Sou, MD  Patient ID: Johnny Atkins, male    DOB: 12/01/1970, 51 y.o.   MRN: [id number]  Chief Complaint  Patient presents with   Results   Follow-up   HPI:    Johnny Atkins  is a 51 y.o. [place] male patient with hypertension, smokes cigarettes recreationally about 1 to 2 cigarettes in a month, exercises avidly and runs at least 20 to 25 miles a week, referred to me for evaluation of mild chest discomfort, elevated heart rate with exercise in view of other risk factors including hyperglycemia and [place] heritage.  Patient states that occasionally he gets left arm pain and also left upper chest pain during rest and last only a short period of time.  No other associated symptoms.  He has not had any chest pain with exertion activity per se.  Is also noticed elevated heart rate with exercise and he has been concerned about this as it has been exaggerated recently and has noticed his heart rate to go all the way up to 162 178 bpm with exercise.  No other associated symptoms.  He is accompanied by his wife.  Past Medical History:  Diagnosis Date   Arthritis    Primarily knees, mild osteo   Colon cancer screening    pt chose cologuard 09/2019   Gout    2 attacks in right MTP joint around 2005; 2 more flares in foot/toe 2013.  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   Right shoulder pain 07/2015   Right AC synovitis (Dr. Barbaraann Barthel)   Seasonal allergies    Wrist pain    For 2-3 years in his 51s.  Extensive multidisciplinary [facility]/u at [facility] and [facility] revealed NO ABNORMALITY.  Improved with ibuprofen and essentially has spontaneously resolved.   Past Surgical History:  Procedure Laterality Date   COLONOSCOPY W/ POLYPECTOMY  09/2020   2022 nonadenomatous   coronary  calcium score  09/02/2021   ZERO   ETT  09/08/2021   NORMAL/LOW RISK (Dr. Einar Gip)   TRANSTHORACIC ECHOCARDIOGRAM  09/16/2021   normal   Family History  Problem Relation Age of Onset   Arthritis Mother        osteo (no gout)   Arthritis Father        osteo (no gout)   Colon polyps Neg Hx    Colon cancer Neg Hx    Esophageal cancer Neg Hx    Rectal cancer Neg Hx    Stomach cancer Neg Hx     Social History   Tobacco Use   Smoking status: Some Days    Types: Pipe, Cigars    Last attempt to quit: 2015    Years since quitting: 8.5   Smokeless tobacco: Never  Substance Use Topics   Alcohol use: Yes    Alcohol/week: 4.0 standard drinks of alcohol    Types: 4 Standard drinks or equivalent per week    Comment: OCC   Marital Status: Married  ROS  Review of Systems  Cardiovascular:  Positive for chest pain. Negative for dyspnea on exertion and leg swelling.   Objective  Blood pressure 138/85, pulse 80, temperature 97.7 F (36.5 C), temperature source Temporal, resp. rate 16, height '5\' 10"'$  (1.778 m), weight 183  lb (83 kg), SpO2 98 %. Body mass index is 26.26 kg/m.     09/26/2021    9:25 AM 08/24/2021   12:17 PM 08/24/2021   11:05 AM  Vitals with BMI  Height '5\' 10"'$   5' 10.5"  Weight 183 lbs  179 lbs 3 oz  BMI 36.64  40.34  Systolic 742 595 638  Diastolic 85 96 87  Pulse 80  64    Physical Exam Neck:     Vascular: No JVD.  Cardiovascular:     Rate and Rhythm: Normal rate and regular rhythm.     Pulses: Intact distal pulses.     Heart sounds: Normal heart sounds. No murmur heard.    No gallop.  Pulmonary:     Effort: Pulmonary effort is normal.     Breath sounds: Normal breath sounds.  Abdominal:     General: Bowel sounds are normal.     Palpations: Abdomen is soft.  Musculoskeletal:     Right lower leg: No edema.     Left lower leg: No edema.     Medications and allergies  No Known Allergies   Medication list after today's encounter   Current Outpatient  Medications:    allopurinol (ZYLOPRIM) 300 MG tablet, Take 1.5 tablets (450 mg total) by mouth daily., Disp: 135 tablet, Rfl: 1   omeprazole (PRILOSEC) 20 MG capsule, Take 1 capsule (20 mg total) by mouth daily as needed., Disp: 90 capsule, Rfl: 0  Laboratory examination:   Recent Labs    08/08/21 1002  NA 140  K 4.4  CL 108  CO2 25  GLUCOSE 94  BUN 11  CREATININE 0.93  CALCIUM 9.0   CrCl cannot be calculated (Patient's most recent lab result is older than the maximum 21 days allowed.).     Latest Ref Rng & Units 08/08/2021   10:02 AM 08/06/2020    8:37 AM 07/02/2020    9:33 AM  CMP  Glucose 70 - 99 mg/dL 94  93  90   BUN 6 - 23 mg/dL '11  18  21   '$ Creatinine 0.40 - 1.50 mg/dL 0.93  1.11  1.12   Sodium 135 - 145 mEq/L 140  140  141   Potassium 3.5 - 5.1 mEq/L 4.4  4.7  4.5   Chloride 96 - 112 mEq/L 108  105  105   CO2 19 - 32 mEq/L '25  24  26   '$ Calcium 8.4 - 10.5 mg/dL 9.0  9.8  9.6   Total Protein 6.0 - 8.3 g/dL 6.3  7.0  6.9   Total Bilirubin 0.2 - 1.2 mg/dL 0.4  1.2  1.1   Alkaline Phos 39 - 117 U/L 75  66  70   AST 0 - 37 U/L '14  16  16   '$ ALT 0 - 53 U/L '14  14  14       '$ Latest Ref Rng & Units 08/08/2021   10:02 AM 08/06/2020    8:37 AM 07/02/2020    9:33 AM  CBC  WBC 4.0 - 10.5 K/uL 7.2  5.1  4.9   Hemoglobin 13.0 - 17.0 g/dL 13.2  14.3  14.3   Hematocrit 39.0 - 52.0 % 40.7  43.3  42.6   Platelets 150.0 - 400.0 K/uL 189.0  156.0  137.0     Lipid Panel Recent Labs    08/08/21 1002  CHOL 137  TRIG 89.0  LDLCALC 74  VLDL 17.8  HDL 45.90  CHOLHDL  3    HEMOGLOBIN A1C Lab Results  Component Value Date   HGBA1C 6.0 08/08/2021   MPG 114 11/23/2017   TSH Recent Labs    08/08/21 1002  TSH 1.80   Radiology:   Coronary calcium score 09/04/2021: Coronary calcium score of 0.  Ascending and descending thoracic aortic measurements are normal.  No significant extracardiac abnormalities.  Cardiac Studies:   PCV CARDIAC STRESS TEST  09/08/2021  Narrative Exercise treadmill stress test 09/08/2021: Exercise treadmill stress test performed using Bruce protocol.  Patient reached 11.4 METS, and 105% of age predicted maximum heart rate.  Exercise capacity was excellent.  No chest pain reported.  Normal heart rate response. Resting hypertension 150/90 mmHg, with appropriate exercise response, with peak BP 200/60 mmHg. Stress EKG revealed no ischemic changes. Low risk study.   PCV ECHOCARDIOGRAM COMPLETE 09/16/2021  Narrative Echocardiogram 09/16/2021: Normal LV systolic function with EF 68%. Left ventricle cavity is normal in size. Normal left ventricular wall thickness. Normal global wall motion. Normal diastolic filling pattern. Calculated EF 68%. Structurally normal tricuspid valve with trace regurgitation. No evidence of pulmonary hypertension.     EKG:   EKG 08/24/2021: EKG 08/24/2021: Normal sinus rhythm at rate of 64 bpm, normal axis, incomplete right bundle branch block.  Normal EKG.    Assessment     ICD-10-CM   1. Exercise-induced tachycardia  R00.0     2. Elevated BP without diagnosis of hypertension  R03.0        There are no discontinued medications.   No orders of the defined types were placed in this encounter.  No orders of the defined types were placed in this encounter.  Recommendations:   Abdalrahman HYMEN ARNETT is a 51 y.o. [place] male patient with hypertension, smokes cigarettes recreationally about 1 to 2 cigarettes in a month, exercises avidly and runs at least 20 to 25 miles a week, referred to me for evaluation of mild chest discomfort, elevated heart rate with exercise in view of other risk factors including hyperglycemia and [place] heritage.  His chest pain symptoms appear to be most atypical and mostly musculoskeletal.  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  From cardiac standpoint, I have recommended coronary calcium score and an echocardiogram.  I will also perform routine treadmill exercise stress test to evaluate his heart rate response and specifically look for blood pressure response as his blood pressure was markedly elevated.  His blood pressure was well controlled and normal when he had a visit with his PCP.  He may potentially have labile hypertension as well.  Some of the symptoms of dyspnea and also chest pain may be related to GERD, he has been having worsening symptoms of GERD lately.  I prescribed him PPI, omeprazole to be taken on a as needed basis.  I will see him back in 6 to 8 weeks for follow-up.    Adrian Prows, MD, [facility] 09/26/2021, 9:37 AM Office: [phone]

## 2021-09-28 NOTE — Progress Notes (Unsigned)
Johnny Atkins [address] [place] Phone: [phone] Subjective:   IVilma Atkins, am serving as a scribe for Dr. Hulan Saas.  I'm seeing this patient by the request  of:  McGowen, Adrian Blackwater, MD  CC: Left knee pain  FAO:[id number]  Johnny Atkins is a 51 y.o. male coming in with complaint of L knee and right ankle pain. Patient states for 2 months pain on medial side of knee. Hurts when when running and with knee flexion. Started bad and got a little better, but hasn't gotten better. Used to do 20 miles a week, but now only about mile or two. In ankle pain is located in the achilles, flare up about two days ago. Initially tried interventions and didn't work.      Past Medical History:  Diagnosis Date   Arthritis    Primarily knees, mild osteo   Colon cancer screening    pt chose cologuard 09/2019   Gout    2 attacks in right MTP joint around 02-Jun-2003; 2 more flares in foot/toe 06/02/11.  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   Right shoulder pain 07/2015   Right AC synovitis (Dr. Barbaraann Barthel)   Seasonal allergies    Wrist pain    For 2-3 years in his 20s.  Extensive multidisciplinary w/u at [facility] revealed NO ABNORMALITY.  Improved with ibuprofen and essentially has spontaneously resolved.   Past Surgical History:  Procedure Laterality Date   COLONOSCOPY W/ POLYPECTOMY  09/2020   06-01-20 nonadenomatous   coronary calcium score  09/02/2021   ZERO   ETT  09/08/2021   NORMAL/LOW RISK (Dr. Einar Gip)   TRANSTHORACIC ECHOCARDIOGRAM  09/16/2021   normal   Social History   Socioeconomic History   Marital status: Married    Spouse name: Not on file   Number of children: 2   Years of education: Not on file   Highest education level: Not on file  Occupational  History   Not on file  Tobacco Use   Smoking status: Some Days    Types: Pipe, Cigars    Last attempt to quit: 2013/06/01    Years since quitting: 8.5   Smokeless tobacco: Never  Vaping Use   Vaping Use: Never used  Substance and Sexual Activity   Alcohol use: Yes    Alcohol/week: 4.0 standard drinks of alcohol    Types: 4 Standard drinks or equivalent per week    Comment: OCC   Drug use: No   Sexual activity: Not on file  Other Topics Concern   Not on file  Social History Narrative   Married, 2 daughters (7 y/o and 90 y/o).   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   Regular Exercise: 1-2 days per week (elliptical machine 30 min).  He is a vegetarian (tends to eat lots of lentils, bread, rice).     No cigarettes but occasional cigar/pipe.  Two alcohol drinks per week (beer or whisky).   Born in [place], moved to [place] in 06[place]02[place].  He is the youngest of 5 siblings.  All siblings healthy but one died in 1998/06/02 of a "stomach problem" rather suddenly.   ?BCG given in [place]?  Positive PPD here in 2005, CXR neg--no treatment.         Social Determinants of Health   Financial Resource Strain: Not on file  Food Insecurity: Not on file  Transportation Needs: Not on file  Physical Activity: Not on file  Stress: Not on file  Social Connections: Not on file   No Known Allergies Family History  Problem Relation Age of Onset   Arthritis Mother        osteo (no gout)   Arthritis Father        osteo (no gout)   Colon polyps Neg Hx    Colon cancer Neg Hx    Esophageal cancer Neg Hx    Rectal cancer Neg Hx    Stomach cancer Neg Hx        Current Outpatient Medications (Analgesics):    allopurinol (ZYLOPRIM) 300 MG tablet, Take 1.5 tablets (450 mg total) by mouth daily.   Current Outpatient Medications (Other):    Vitamin D, Ergocalciferol, (DRISDOL) 1.25 MG (50000 UNIT) CAPS capsule, Take 1 capsule (50,000 Units total) by mouth every 7 (seven)  days.   omeprazole (PRILOSEC) 20 MG capsule, Take 1 capsule (20 mg total) by mouth daily as needed.   Reviewed prior external information including notes and imaging from  primary care provider including most recent labs including uric acid, PSA and thyroid as well as CMP all being normal As well as notes that were available from care everywhere and other healthcare systems.  Past medical history, social, surgical and family history all reviewed in electronic medical record.  No pertanent information unless stated regarding to the chief complaint.   Review of Systems:  No headache, visual changes, nausea, vomiting, diarrhea, constipation, dizziness, abdominal pain, skin rash, fevers, chills, night sweats, weight loss, swollen lymph nodes, body aches, joint swelling, chest pain, shortness of breath, mood changes. POSITIVE muscle aches  Objective  Blood pressure 124/86, pulse 77, height '5\' 10"'$  (1.778 m), weight 182 lb (82.6 kg), SpO2 95 %.   General: No apparent distress alert and oriented x3 mood and affect normal, dressed appropriately.  HEENT: Pupils equal, extraocular movements intact  Respiratory: Patient's speak in full sentences and does not appear short of breath  Cardiovascular: No lower extremity edema, non tender, no erythema  Right ankle does have abnormality noted of the skin.  This is at the Achilles.  Patient does have tender to palpation in the area. Left knee exam good range of motion noted.  Tender to palpation over the medial joint line.  Negative McMurray's noted.  Full range of motion.  Mild discomfort over the patella tendon.  Limited muscular skeletal ultrasound was performed and interpreted by Hulan Saas, M  Limited ultrasound of patient's left knee has a cortical irregularity noted of the appears not to have any significant healing aspect.  Regarding patient's Achilles no significant hypoechoic change is rotation of the Achilles tendon.  Patient does have some  calcific changes noted spurring of the posterior calcaneal area.  Impression: Calcific Achilles spurring of the calcaneal area and abnormality of the tibia medially.    Impression and Recommendations:    The above documentation has been reviewed and is accurate and complete Lyndal Pulley, DO

## 2021-09-29 ENCOUNTER — Ambulatory Visit (INDEPENDENT_AMBULATORY_CARE_PROVIDER_SITE_OTHER): Payer: 59

## 2021-09-29 ENCOUNTER — Ambulatory Visit: Payer: Self-pay

## 2021-09-29 ENCOUNTER — Other Ambulatory Visit: Payer: Self-pay

## 2021-09-29 ENCOUNTER — Ambulatory Visit: Payer: 59 | Admitting: Cardiology

## 2021-09-29 ENCOUNTER — Encounter: Payer: Self-pay | Admitting: Family Medicine

## 2021-09-29 ENCOUNTER — Ambulatory Visit: Payer: 59 | Admitting: Family Medicine

## 2021-09-29 VITALS — BP 124/86 | HR 77 | Ht 70.0 in | Wt 182.0 lb

## 2021-09-29 DIAGNOSIS — M6528 Calcific tendinitis, other site: Secondary | ICD-10-CM

## 2021-09-29 DIAGNOSIS — M25571 Pain in right ankle and joints of right foot: Secondary | ICD-10-CM

## 2021-09-29 DIAGNOSIS — M2550 Pain in unspecified joint: Secondary | ICD-10-CM | POA: Diagnosis not present

## 2021-09-29 DIAGNOSIS — G8929 Other chronic pain: Secondary | ICD-10-CM | POA: Insufficient documentation

## 2021-09-29 DIAGNOSIS — M25562 Pain in left knee: Secondary | ICD-10-CM

## 2021-09-29 LAB — IBC PANEL
Iron: 83 ug/dL (ref 42–165)
Saturation Ratios: 22.3 % (ref 20.0–50.0)
TIBC: 372.4 ug/dL (ref 250.0–450.0)
Transferrin: 266 mg/dL (ref 212.0–360.0)

## 2021-09-29 LAB — VITAMIN D 25 HYDROXY (VIT D DEFICIENCY, FRACTURES): VITD: 11.4 ng/mL — ABNORMAL LOW (ref 30.00–100.00)

## 2021-09-29 LAB — TESTOSTERONE: Testosterone: 152.01 ng/dL — ABNORMAL LOW (ref 300.00–890.00)

## 2021-09-29 LAB — FERRITIN: Ferritin: 48.9 ng/mL (ref 22.0–322.0)

## 2021-09-29 LAB — VITAMIN B12: Vitamin B-12: 169 pg/mL — ABNORMAL LOW (ref 211–911)

## 2021-09-29 MED ORDER — VITAMIN D (ERGOCALCIFEROL) 1.25 MG (50000 UNIT) PO CAPS: 50000.0000 [IU] | ORAL_CAPSULE | ORAL | 0 refills | Status: DC

## 2021-09-29 MED ORDER — VITAMIN D (ERGOCALCIFEROL) 1.25 MG (50000 UNIT) PO CAPS: 50000.0000 [IU] | ORAL_CAPSULE | ORAL | 0 refills | Status: AC

## 2021-09-29 NOTE — Patient Instructions (Addendum)
Vit D weekly prescribed Xrays today Labs today Shockwave therapy schedule at checkout Heel lifts in both shoes Recovery sandals (Hoka or Oofos)

## 2021-09-29 NOTE — Assessment & Plan Note (Signed)
Patient does have a little pain.  Seems to be on the medial aspect of the knee.  On ultrasound there is a cortical irregularity noted.  Could be concerning for potential stress reaction.  Unfortunately if this is true and patient has already decreased the amount of running and continues to have this laboratory work-up is necessary to further evaluate why patient is not healing appropriately.  Follow-up with me again in 4 weeks and hopefully we can start to progress patient otherwise.  X-rays are pending.

## 2021-09-29 NOTE — Assessment & Plan Note (Signed)
Chronic problem with potential exacerbation.  Discussed the possibility of soundwave therapy that I think could be beneficial.  Patient has had this done for some time and I do think it is worthwhile for [place] to check laboratory work-up to see if anything else is contributing.  Patient also has what appears to be a stress reaction in the tibial plateau area that I do think we need to continue to monitor as well.  Discussed which activities to do and which ones to avoid.  Follow-up with me again in 4 to 6 weeks otherwise.  Prescription of vitamin D given as well.

## 2021-09-30 ENCOUNTER — Encounter: Payer: Self-pay | Admitting: Family Medicine

## 2021-10-10 ENCOUNTER — Encounter: Payer: Self-pay | Admitting: Family Medicine

## 2021-10-14 ENCOUNTER — Ambulatory Visit (INDEPENDENT_AMBULATORY_CARE_PROVIDER_SITE_OTHER): Payer: Self-pay | Admitting: Family Medicine

## 2021-10-14 DIAGNOSIS — M25562 Pain in left knee: Secondary | ICD-10-CM

## 2021-10-14 DIAGNOSIS — M25571 Pain in right ankle and joints of right foot: Secondary | ICD-10-CM

## 2021-10-14 NOTE — Patient Instructions (Signed)
Thank you for coming in today.   Schedule the next 3 shockwave treatments weekly.   You should hear from MRI scheduling within 1 week. If you do not hear please let me know.

## 2021-10-14 NOTE — Progress Notes (Signed)
   [facility] [place] Phone: [phone]   Extracorporeal Shockwave Therapy Note    Patient is being treated today with ECSWT. Informed consent was obtained and patient tolerated procedure well.   Therapy performed by Lynne Leader  Condition treated: Rt Achilles tendinitis Treatment preset used: Heel spur Energy used: 90 mJ Frequency used: 10 Hz Number of pulses: 2000 Treatment #1 of #4-6  Electronically signed by:  Johnny Atkins [facility] 8:09 AM 10/14/21   He additionally has left knee pain that is being evaluated by Dr. Tamala Julian with concern for meniscus tear or even stress fracture.  This could potentially be treated with shockwave but before we do that an MRI should be completed so I have a better understanding of what were trying to treat.  Plan to proceed to MRI as well for the left knee.

## 2021-10-16 ENCOUNTER — Ambulatory Visit (INDEPENDENT_AMBULATORY_CARE_PROVIDER_SITE_OTHER): Payer: 59

## 2021-10-16 DIAGNOSIS — M25562 Pain in left knee: Secondary | ICD-10-CM | POA: Diagnosis not present

## 2021-10-19 ENCOUNTER — Telehealth: Payer: Self-pay

## 2021-10-19 NOTE — Telephone Encounter (Signed)
I called Horice back and we talked about his MRI results.  He is scheduled to see me on September 1.  At that time we will go over the MRI  again as well as potential treatment options including both steroid injections and hyaluronic acid injections.  We are working on authorization now for hyaluronic acid injections.  Anticipate that if not better with injection and surgical consultation.

## 2021-10-19 NOTE — Telephone Encounter (Signed)
Spoke w/ pt and conveyed Dr. Clovis Riley result note. Pt was dissatisfied w/ the significant findings of the knee MRI and did not want to wait to discuss at his f/u on 9/1. Pt requests that you can him to discuss.

## 2021-10-19 NOTE — Progress Notes (Signed)
Knee MRI shows a medial meniscus tear and some medium arthritis changes in the knee with small areas where the cartilage has been completely lost (focal full-thickness cartilage defects).  The arthritis and cartilage defects are probably the source of pain.  The meniscus is probably contributing to this as well. We will work on authorization for one of the next steps that we can do that is not surgery which are gel shots (Hyaluronic acid injection).  We can talk about this in full detail on your follow-up visit on September 1.

## 2021-10-27 NOTE — Progress Notes (Signed)
   Larinda But[facility] [place] Phone: [phone]   Extracorporeal Shockwave Therapy Note    Patient is being treated today with ECSWT. Informed consent was obtained and patient tolerated procedure well.   Therapy performed by Lynne Leader  Condition treated: Right Achilles tendinitis Treatment preset used: Default 90 Energy used: 90 mJ Frequency used: 10 Hz Number of pulses: 2000 Treatment #2 of #4-6  Electronically signed by:  [facility] 8:51 AM 10/28/21  Return in 1 week for shockwave treatment #3

## 2021-10-28 ENCOUNTER — Ambulatory Visit (INDEPENDENT_AMBULATORY_CARE_PROVIDER_SITE_OTHER): Payer: 59 | Admitting: Family Medicine

## 2021-10-28 VITALS — BP 138/86 | HR 76 | Ht 70.0 in | Wt 181.2 lb

## 2021-10-28 DIAGNOSIS — M6528 Calcific tendinitis, other site: Secondary | ICD-10-CM

## 2021-10-28 DIAGNOSIS — M25562 Pain in left knee: Secondary | ICD-10-CM | POA: Diagnosis not present

## 2021-10-28 DIAGNOSIS — S83242A Other tear of medial meniscus, current injury, left knee, initial encounter: Secondary | ICD-10-CM

## 2021-10-28 DIAGNOSIS — G8929 Other chronic pain: Secondary | ICD-10-CM

## 2021-10-28 NOTE — Patient Instructions (Signed)
Thank you for coming in today.    

## 2021-10-28 NOTE — Progress Notes (Signed)
Raciel KAEDON FANELLI is a 51 y.o. male who presents to [facility] today for evaluation for left knee pain.  Early has been seen several times by Dr. Tamala Julian my partner for knee pain.  Eventually we proceeded to an MRI which she had on August 20 and is here to discuss.  Dominant findings of MRI are oblique posterior horn medial meniscus tear and moderate medial compartment degenerative changes with focal areas of full-thickness cartilage defects.  He notes knee pain especially with running.  He denies much pain with sitting or walking normal activities aside from running or deep knee bending.   Pertinent review of systems: No fevers or chills  Relevant historical information: Achilles tendinitis   Exam:   General: Well Developed, well nourished, and in no acute distress.   MSK: Left knee: Minimal to no effusion.  Normal motion.    Lab and Radiology Results  EXAM: MRI OF THE LEFT KNEE WITHOUT CONTRAST   TECHNIQUE: Multiplanar, multisequence MR imaging of the left was performed. No intravenous contrast was administered.   COMPARISON:  Radiographs dated September 29, 2021   FINDINGS: MENISCI   Medial: Horizontal oblique undersurface tear of the body/posterior horn junction of the medial meniscus.   Lateral: Intact.   LIGAMENTS   Cruciates: ACL and PCL are intact.   Collaterals: Medial collateral ligament is intact. Lateral collateral ligament complex is intact.   CARTILAGE   Patellofemoral: Focal full-thickness cartilage defect at the lateral patellar facet with subchondral cysts. Generalized articular cartilage thinning.   Medial: Focal full-thickness cartilage defect of the medial femoral condyle with subchondral edema.   Lateral: Articular cartilage thinning at the weight-bearing area without evidence of full-thickness defect.   JOINT: Moderate joint effusion. Normal Hoffa's fat-pad. No plical thickening.   POPLITEAL FOSSA:  Popliteus tendon is intact. Small Baker's cyst.   EXTENSOR MECHANISM: Intact quadriceps tendon. Intact patellar tendon. Intact lateral patellar retinaculum. Intact medial patellar retinaculum. Intact MPFL.   BONES: No aggressive osseous lesion. No fracture or dislocation.   Other: No fluid collection or hematoma. Muscles are normal.   IMPRESSION: 1. Horizontal oblique undersurface tear of the body/posterior horn of the medial meniscus. 2. 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 3. Moderate knee joint effusion. 4. Cruciate and collateral ligaments are intact. Quadriceps tendon and patellar tendon are intact.     Electronically Signed   By: Keane Police D.O.   On: 10/18/2021 14:32 I, Lynne Leader, personally (independently) visualized and performed the interpretation of the images attached in this note.       Assessment and Plan: 51 y.o. male with left knee pain due to medial compartment DJD seen on MRI.  The medial posterior meniscus tear is a factor as well.  His pain is only really present with running and deep knee flexion.  We talked about treatment options.  Surgical consultation for evaluation for an attempt at a meniscus repair is a possibility here.  He has a posterior meniscus root tear which is theoretically potentially repairable.  He would like to avoid surgery which is rational.  The next treatment option would be hyaluronic acid injections.  After discussion would like to hold off on that for now and continue home exercise program and quad strengthening and modify his running.  He will give up running and try doing exercise biking and if that is satisfying to him we will keep an eye on  his knee and potentially do a gel shot in the future.  Check back as needed.  Total encounter time 20 minutes including face-to-face time with the patient and,  reviewing past medical record, and charting on the date of service.   Reviewed knee MRI images and report and discussed treatment plan and options.    Discussed warning signs or symptoms. Please see discharge instructions. Patient expresses understanding.   The above documentation has been reviewed and is accurate and complete Lynne Leader, M.D.

## 2021-11-10 NOTE — Progress Notes (Unsigned)
[facility] [address] [place] Phone: [phone] Subjective:   IVilma Meckel, am serving as a scribe for Dr. Hulan Saas.  I'm seeing this patient by the request  of:  McGowen, Adrian Blackwater, MD  CC: Knee pain follow-up  GEX:[id number]  09/29/2021 Patient does have a little pain.  Seems to be on the medial aspect of the knee.  On ultrasound there is a cortical irregularity noted.  Could be concerning for potential stress reaction.  Unfortunately if this is true and patient has already decreased the amount of running and continues to have this laboratory work-up is necessary to further evaluate why patient is not healing appropriately.  Follow-up with me again in 4 weeks and hopefully we can start to progress patient otherwise.  X-rays are pending.  Update 11/15/2021 Odean K Twyford is a 51 y.o. male coming in with complaint of L knee and R achilles pain. Patient has been seeing Dr. Georgina Snell for soundwave therapy. Patient states doesn't feel like there is much improvement from soundwave therapy. Pain in achilles comes and goes. Mainly here for left knee pain. Wants go over MRI.  MRI IMPRESSION: 1. Horizontal oblique undersurface tear of the body/posterior horn of the medial meniscus. 2. 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 3. Moderate knee joint effusion. 4. Cruciate and collateral ligaments are intact. Quadriceps tendon and patellar tendon are intact.     Past Medical History:  Diagnosis Date   Arthritis    Primarily knees, mild osteo   Colon cancer screening    pt chose cologuard 09/2019   Gout    2 attacks in right MTP joint around 2005; 2 more flares in foot/toe 2013.  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   Right shoulder pain 07/2015   Right AC synovitis (Dr. Barbaraann Barthel)   Seasonal allergies    Wrist pain    For 2-3 years in his 29s.  Extensive multidisciplinary [facility]/u at [facility] revealed NO ABNORMALITY.  Improved with ibuprofen and essentially has spontaneously resolved.   Past Surgical History:  Procedure Laterality Date   COLONOSCOPY W/ POLYPECTOMY  09/2020   2022 nonadenomatous   coronary calcium score  09/02/2021   ZERO   ETT  09/08/2021   NORMAL/LOW RISK (Dr. Einar Gip)   TRANSTHORACIC ECHOCARDIOGRAM  09/16/2021   normal   Social History   Socioeconomic History   Marital status: Married    Spouse name: Not on file   Number of children: 2   Years of education: Not on file   Highest education level: Not on file  Occupational History   Not on file  Tobacco Use   Smoking status: Some Days    Types: Pipe, Cigars    Last attempt to quit: 2015    Years since quitting: 8.7   Smokeless tobacco: Never  Vaping Use   Vaping Use: Never used  Substance and Sexual Activity   Alcohol use: Yes    Alcohol/week: 4.0 standard drinks of alcohol    Types: 4 Standard drinks or equivalent per week    Comment: OCC   Drug use: No   Sexual activity: Not on file  Other Topics Concern   Not  on file  Social History Narrative   Married, 2 daughters (9 y/o and 57 y/o).   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   Regular Exercise: 1-2 days per week (elliptical machine 30 min).  He is a vegetarian (tends to eat lots of lentils, bread, rice).     No cigarettes but occasional cigar/pipe.  Two alcohol drinks per week (beer or whisky).   Born in [place], moved to [place] in [place].  He is the youngest of 5 siblings.  All siblings healthy but one died in May 28, 1998 of a "stomach problem" rather suddenly.   ?BCG given in [place]?  Positive PPD here in 05-28-2003, CXR neg--no treatment.          Social Determinants of Health   Financial Resource Strain: Not on file  Food Insecurity: Not on file  Transportation Needs: Not on file  Physical Activity: Not on file  Stress: Not on file  Social Connections: Not on file   No Known Allergies Family History  Problem Relation Age of Onset   Arthritis Mother        osteo (no gout)   Arthritis Father        osteo (no gout)   Colon polyps Neg Hx    Colon cancer Neg Hx    Esophageal cancer Neg Hx    Rectal cancer Neg Hx    Stomach cancer Neg Hx        Current Outpatient Medications (Analgesics):    allopurinol (ZYLOPRIM) 300 MG tablet, Take 1.5 tablets (450 mg total) by mouth daily.   Current Outpatient Medications (Other):    omeprazole (PRILOSEC) 20 MG capsule, Take 1 capsule (20 mg total) by mouth daily as needed.   Vitamin D, Ergocalciferol, (DRISDOL) 1.25 MG (50000 UNIT) CAPS capsule, Take 1 capsule (50,000 Units total) by mouth every 7 (seven) days.   Reviewed prior external information including notes and imaging from  primary care provider As well as notes that were available from care everywhere and other healthcare systems.  Past medical history, social, surgical and family history all reviewed in electronic medical record.  No pertanent information unless stated regarding to the chief complaint.   Review of Systems:  No headache, visual changes, nausea, vomiting, diarrhea, constipation, dizziness, abdominal pain, skin rash, fevers, chills, night sweats, weight loss, swollen lymph nodes, body aches, joint swelling, chest pain, shortness of breath, mood changes. POSITIVE muscle aches  Objective  Blood pressure 118/82, pulse 60, height '5\' 10"'$  (1.778 m), weight 175 lb (79.4 kg), SpO2 99 %.   General: No apparent distress alert and oriented x3 mood and affect normal, dressed appropriately.  HEENT: Pupils equal, extraocular movements intact  Respiratory: Patient's speak in full sentences and does not appear short  of breath  Cardiovascular: No lower extremity edema, non tender, no erythema  Left knee exam does have relatively good range of motion.  Still tender to palpation over the medial joint line.  Mild positive McMurray's noted.  Worsening pain with full flexion on the left side.  Right ankle does have good range of motion.  Does have some tenderness to palpation noted.  Limited muscular skeletal ultrasound was performed and interpreted by Hulan Saas, M  Limited ultrasound shows that patient does have a medial meniscal tear noted.  Mild mild displacement noted.  Resolution of patient's effusion that was noted previously. Impression: Very mild interval improvement of the meniscal tear in this corresponding to the MRI  Impression and Recommendations:     The above documentation has been reviewed and is accurate and complete Lyndal Pulley, DO

## 2021-11-11 ENCOUNTER — Ambulatory Visit: Payer: 59 | Admitting: Family Medicine

## 2021-11-15 ENCOUNTER — Ambulatory Visit: Payer: Self-pay

## 2021-11-15 ENCOUNTER — Encounter: Payer: Self-pay | Admitting: Family Medicine

## 2021-11-15 ENCOUNTER — Ambulatory Visit: Payer: 59 | Admitting: Family Medicine

## 2021-11-15 VITALS — BP 118/82 | HR 60 | Ht 70.0 in | Wt 175.0 lb

## 2021-11-15 DIAGNOSIS — M6528 Calcific tendinitis, other site: Secondary | ICD-10-CM | POA: Diagnosis not present

## 2021-11-15 DIAGNOSIS — S83242A Other tear of medial meniscus, current injury, left knee, initial encounter: Secondary | ICD-10-CM | POA: Diagnosis not present

## 2021-11-15 NOTE — Assessment & Plan Note (Signed)
Calcific changes do seem to be improved.  Did do the vitamin D supplementation as well as the soundwave therapy with patient thanks varying degrees of success.

## 2021-11-15 NOTE — Assessment & Plan Note (Signed)
Patient does have MRI showing tear noted.  We discussed with patient that it is the body in the posterior horn of the medial meniscus.  We also discussed the moderate arthritic changes with the thinning of the cartilage noted.  We discussed different activities and different treatment options.  Patient is elected to try the conservative therapy.  We discussed avoiding any type of twisting motions.  Could be a candidate for viscosupplementation secondary to the arthritic changes as well.  Total time discussing with patient and going over imaging 36 minutes

## 2021-11-15 NOTE — Patient Instructions (Addendum)
Ice and Voltaren Vitamin D3 2000IU daily  Turmeric '500mg'$  daily  Tart cherry '1200mg'$  Ok to run but try to stay on flat surfaces Check back in 6-8 weeks

## 2021-11-18 ENCOUNTER — Ambulatory Visit: Payer: 59 | Admitting: Family Medicine

## 2021-12-04 IMAGING — CT CT ABD-PELV W/ CM
2 of 5 series · 15 of 46 positions shown, 17 images · IV contrast (iopamidol)
Comparison: None

CLINICAL DATA: Mid to lower abdominal pain for 2 months.

EXAM:
CT ABDOMEN AND PELVIS WITH CONTRAST
TECHNIQUE: Multidetector CT imaging of the abdomen and pelvis was performed
using the standard protocol following bolus administration of
intravenous contrast.
CONTRAST:  100mL U85QUT-AQQ IOPAMIDOL (U85QUT-AQQ) INJECTION 61%

[Series 2: abd pelvis 5.00 br40 s3 axial · axial · 0.65mm/px · z∈[+1257,+1652]mm · 12 of 89 slices shown, 14 images]
[im 5/89  soft-tissue]
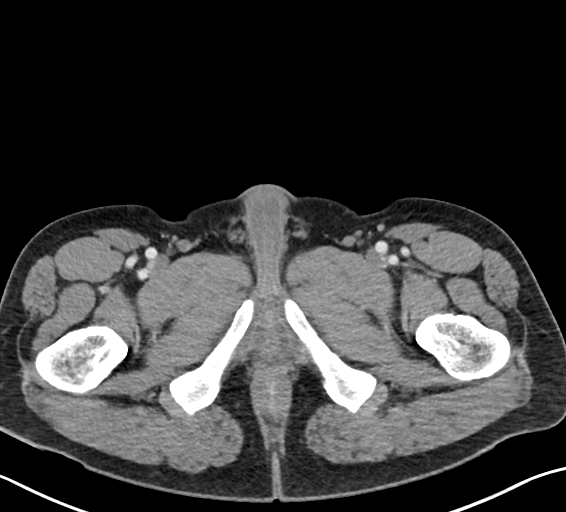
[im 5/89  bone]
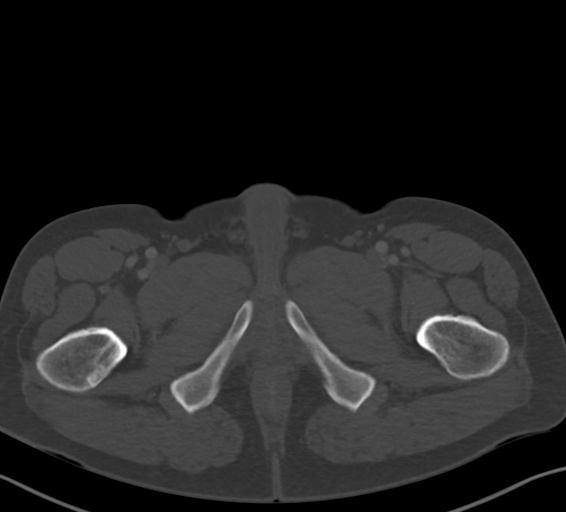
[im 14/89  soft-tissue]
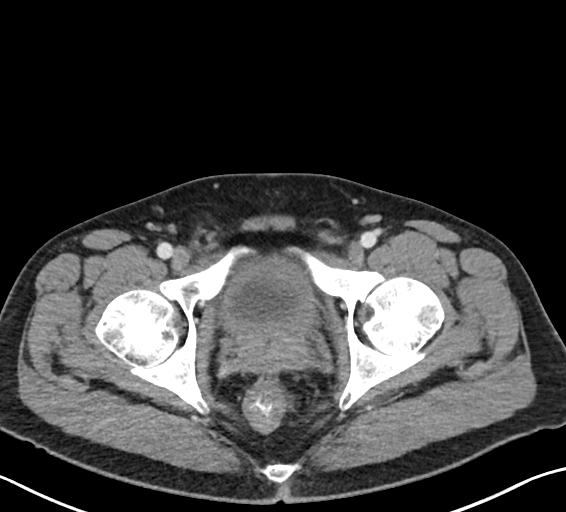
[im 19/89  soft-tissue]
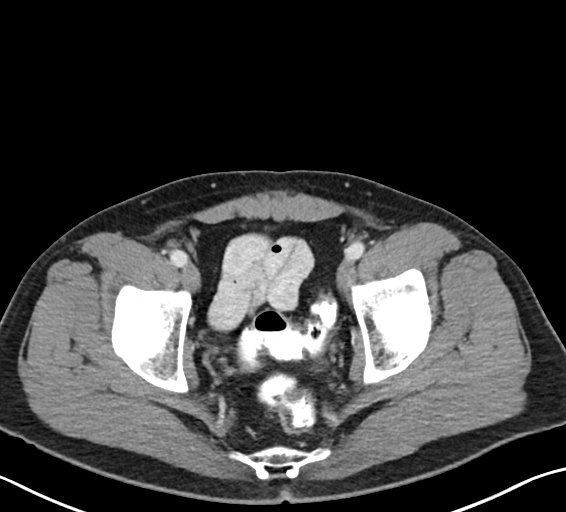
[im 28/89  soft-tissue]
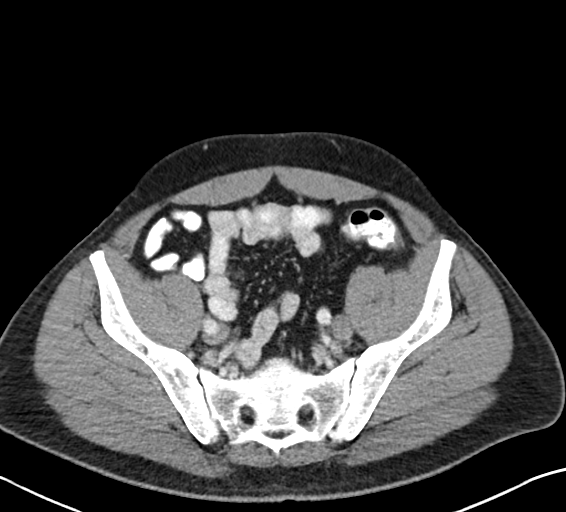
[im 33/89  soft-tissue]
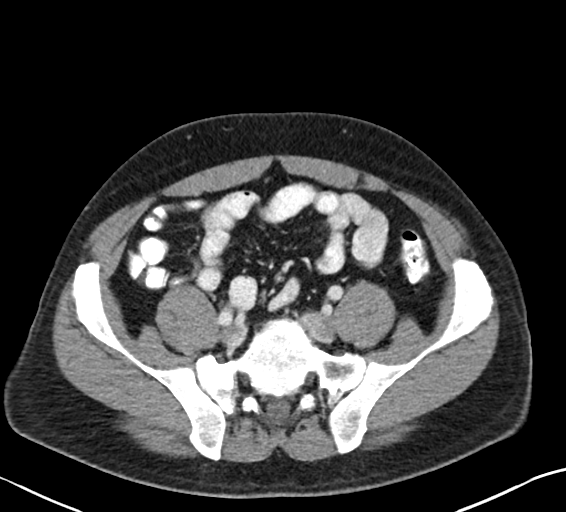
[im 42/89  soft-tissue]
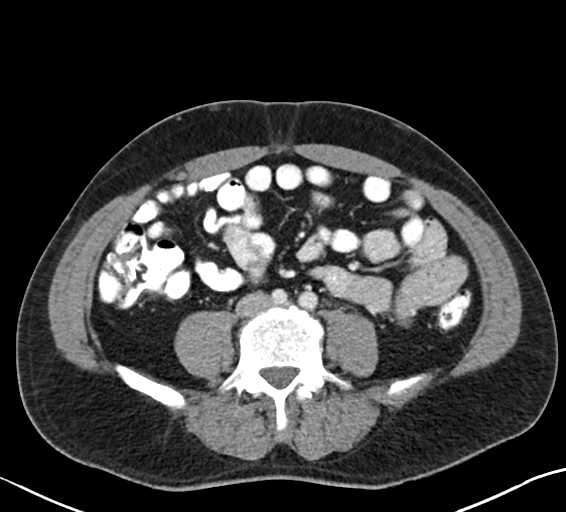
[im 47/89  soft-tissue]
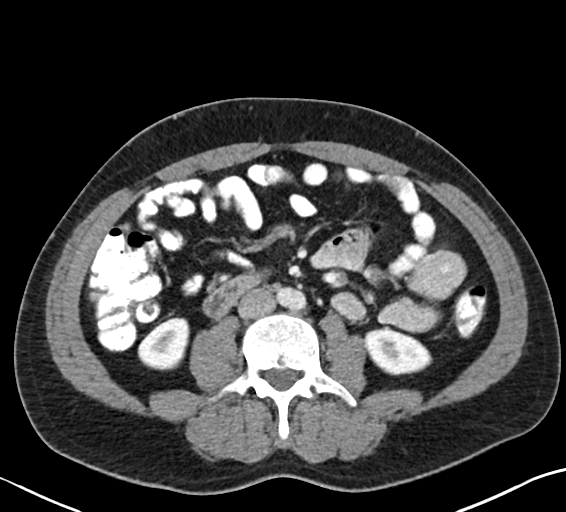
[im 56/89  soft-tissue]
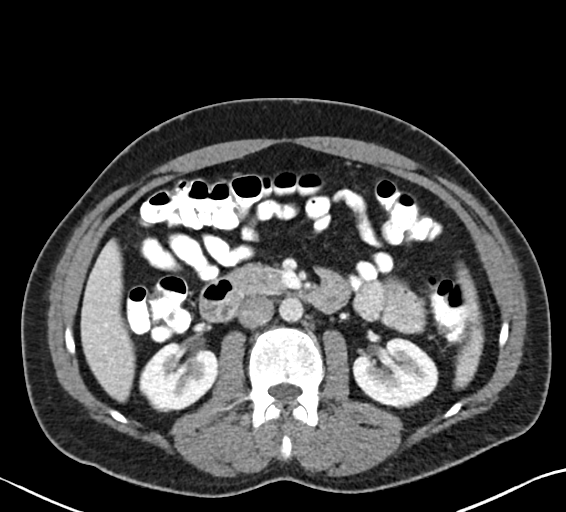
[im 61/89  soft-tissue]
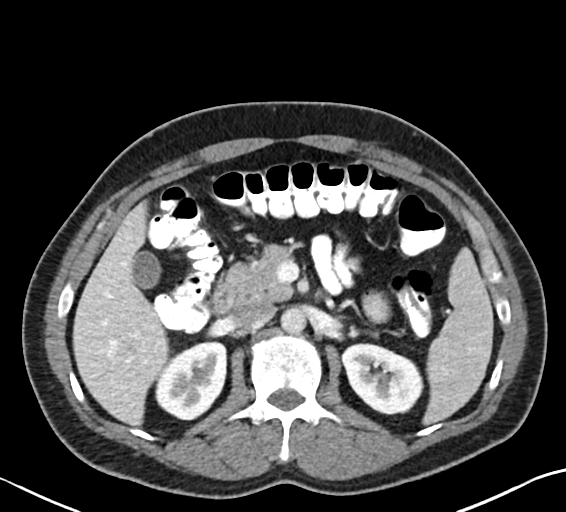
[im 61/89  bone]
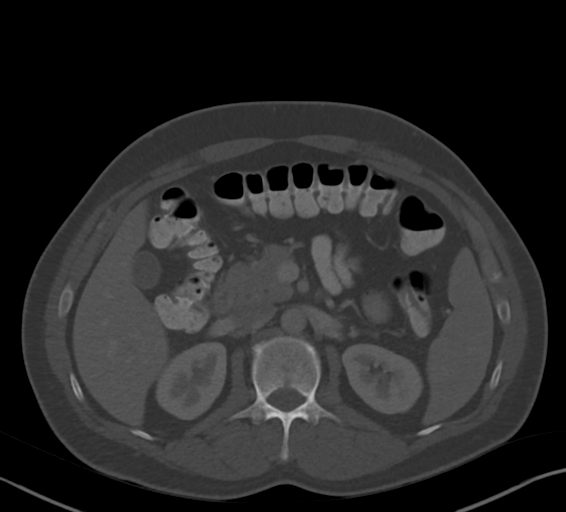
[im 70/89  soft-tissue]
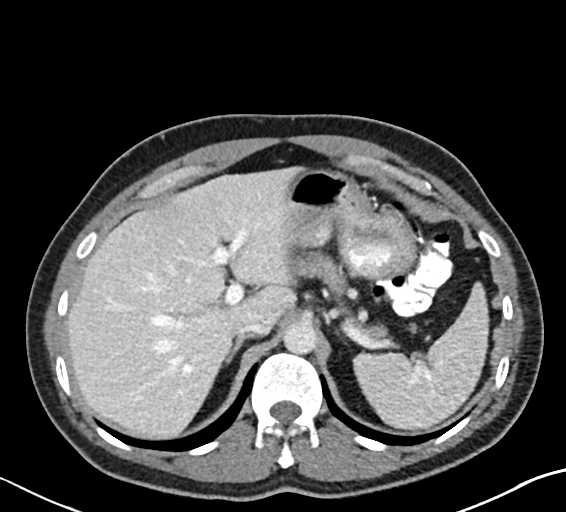
[im 75/89  soft-tissue]
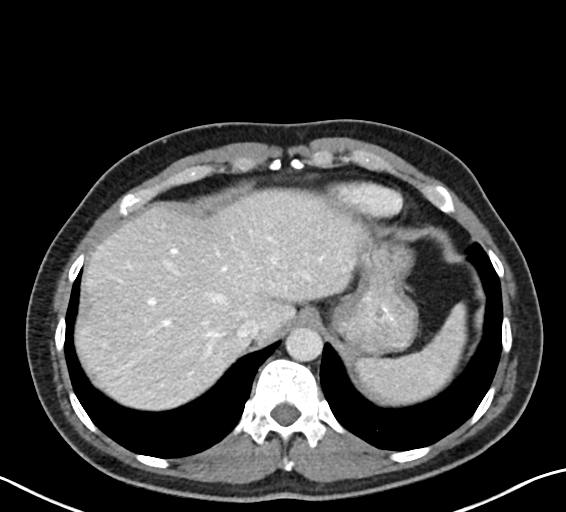
[im 84/89  soft-tissue]
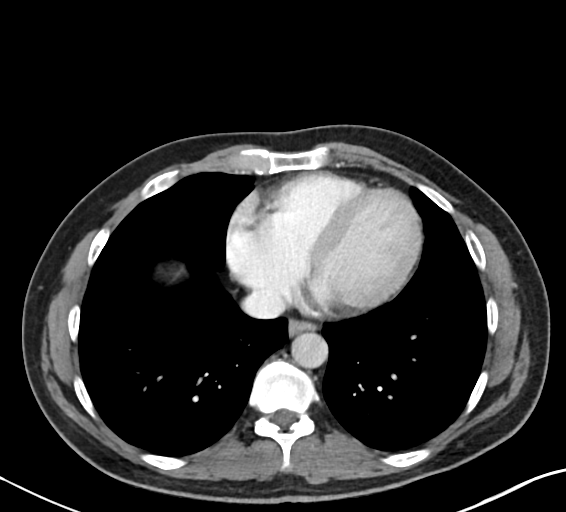

[Series 6: abd pelvis 2.00 br40 s3 cor · coronal · 0.72mm/px · 3 of 134 slices shown]
[im 45/134  soft-tissue]
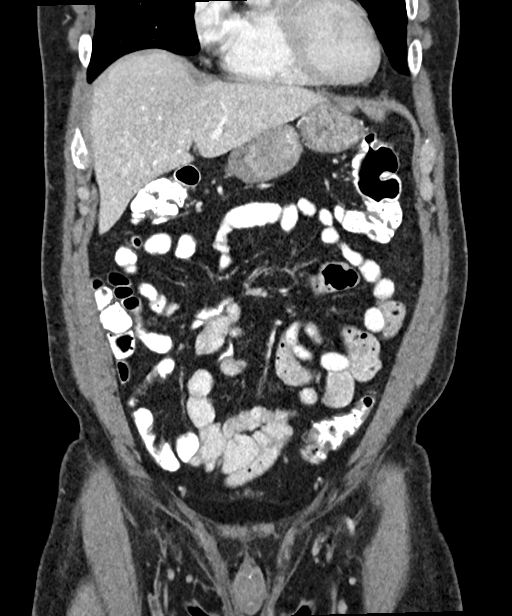
[im 60/134  soft-tissue]
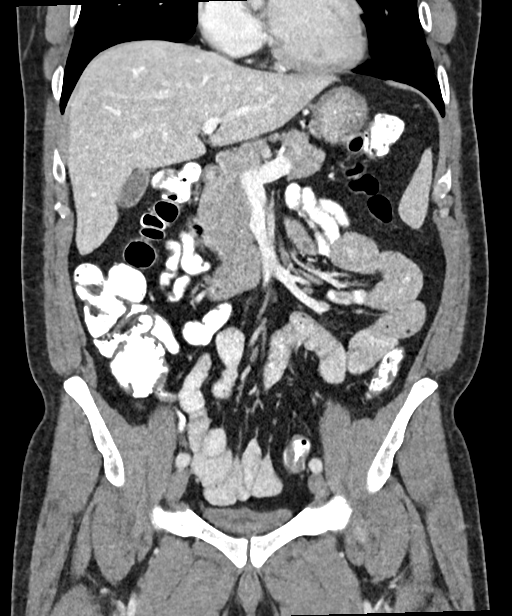
[im 74/134  soft-tissue]
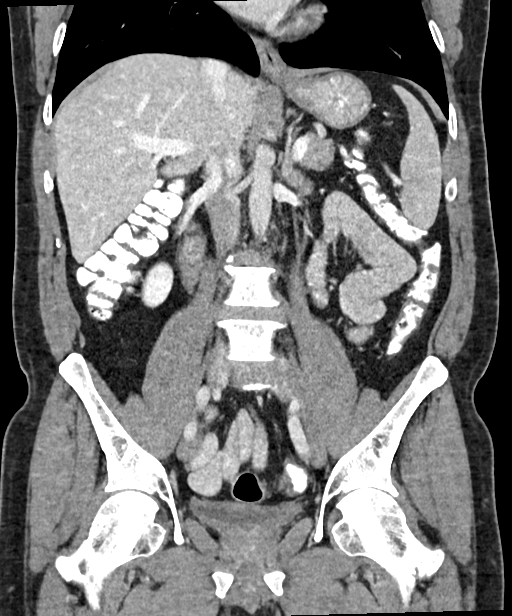

[15 of 46 positions shown; findings below may reference images not displayed]

FINDINGS: Lower chest: Unremarkable

Hepatobiliary: 0.9 by 0.8 cm hypodense lesion in segment 4 of the
liver on image 21 series [DATE] by 0.6 cm hypodense lesion
inferiorly in the right hepatic lobe on image 35 series [DATE] by
0.6 cm hypodense lesion in the dome of the right hepatic lobe on
image 16 series 2. These lesions are technically too small to
characterize although statistically likely to be benign.

The gallbladder appears unremarkable.

Pancreas: Unremarkable

Spleen: Unremarkable

Adrenals/Urinary Tract: Unremarkable

Stomach/Bowel: Unremarkable

Vascular/Lymphatic: Unremarkable

Reproductive: Mild prostatomegaly.

Other: No supplemental non-categorized findings.

Musculoskeletal: Small umbilical hernia contains adipose tissue.
Transitional S1 vertebra. Bilateral foraminal impingement at L5-S1
due to facet and intervertebral spurring along with mild disc bulge.
IMPRESSION: 1. Three hypodense lesions in the liver are technically nonspecific
due to small size, although statistically likely to be benign. If
the patient has abnormal liver enzymes or a history of malignancy
with predilection for hepatic spread (such as gastrointestinal
malignancy) then further workup with hepatic protocol MRI with and
without contrast should be considered.
2. Mild prostatomegaly.
3. Small umbilical hernia contains adipose tissue.
4. Bilateral foraminal impingement at L5-S1. Please note that the S1
vertebra is transitional.

## 2022-01-05 NOTE — Progress Notes (Deleted)
[facility] [place] Phone: [phone] Subjective:    I'm seeing this patient by the request  of:  McGowen, Adrian Blackwater, MD  CC:   XIP:[id number]  11/15/2021 Patient does have MRI showing tear noted.  We discussed with patient that it is the body in the posterior horn of the medial meniscus.  We also discussed the moderate arthritic changes with the thinning of the cartilage noted.  We discussed different activities and different treatment options.  Patient is elected to try the conservative therapy.  We discussed avoiding any type of twisting motions.  Could be a candidate for viscosupplementation secondary to the arthritic changes as well.  Total time discussing with patient and going over imaging 36 minutes      Calcific changes do seem to be improved.  Did do the vitamin D supplementation as well as the soundwave therapy with patient thanks varying degrees of success.     Update 01/10/2022 Johnny Atkins is a 51 y.o. male coming in with complaint of R calf and L knee pain. Patient states     Past Medical History:  Diagnosis Date   Arthritis    Primarily knees, mild osteo   Colon cancer screening    pt chose cologuard 09/2019   Gout    2 attacks in right MTP joint around 2005; 2 more flares in foot/toe 2013.  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   Right shoulder pain 07/2015   Right AC synovitis (Dr. Barbaraann Barthel)   Seasonal allergies    Wrist pain    For 2-3 years in his 47s.  Extensive multidisciplinary w/u at [facility] and [facility] revealed NO ABNORMALITY.  Improved with ibuprofen and essentially has spontaneously resolved.   Past Surgical History:  Procedure Laterality Date   COLONOSCOPY W/ POLYPECTOMY  09/2020   2022 nonadenomatous   coronary  calcium score  09/02/2021   ZERO   ETT  09/08/2021   NORMAL/LOW RISK (Dr. Einar Gip)   TRANSTHORACIC ECHOCARDIOGRAM  09/16/2021   normal   Social History   Socioeconomic History   Marital status: Married    Spouse name: Not on file   Number of children: 2   Years of education: Not on file   Highest education level: Not on file  Occupational History   Not on file  Tobacco Use   Smoking status: Some Days    Types: Pipe, Cigars    Last attempt to quit: 2015    Years since quitting: 8.8   Smokeless tobacco: Never  Vaping Use   Vaping Use: Never used  Substance and Sexual Activity   Alcohol use: Yes    Alcohol/week: 4.0 standard drinks of alcohol    Types: 4 Standard drinks or equivalent per week    Comment: OCC   Drug use: No   Sexual activity: Not on file  Other Topics Concern   Not on file  Social History Narrative   Married, 2 daughters (41 y/o and 46 y/o).   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   Regular Exercise: 1-2 days per week (elliptical machine 30 min).  He is a vegetarian (tends to eat lots of lentils, bread, rice).  No cigarettes but occasional cigar/pipe.  Two alcohol drinks per week (beer or whisky).   Born in [place], moved to [place] in 12-[place].  He is the youngest of 5 siblings.  All siblings healthy but one died in 05-12-98 of a "stomach problem" rather suddenly.   ?BCG given in [place]?  Positive PPD here in 05-12-03, CXR neg--no treatment.         Social Determinants of Health   Financial Resource Strain: Not on file  Food Insecurity: Not on file  Transportation Needs: Not on file  Physical Activity: Not on file  Stress: Not on file  Social Connections: Not on file   No Known Allergies Family History  Problem Relation Age of Onset   Arthritis Mother        osteo (no gout)   Arthritis Father        osteo (no gout)   Colon polyps Neg Hx    Colon cancer Neg Hx    Esophageal cancer Neg Hx    Rectal cancer Neg Hx     Stomach cancer Neg Hx        Current Outpatient Medications (Analgesics):    allopurinol (ZYLOPRIM) 300 MG tablet, Take 1.5 tablets (450 mg total) by mouth daily.   Current Outpatient Medications (Other):    omeprazole (PRILOSEC) 20 MG capsule, Take 1 capsule (20 mg total) by mouth daily as needed.   Vitamin D, Ergocalciferol, (DRISDOL) 1.25 MG (50000 UNIT) CAPS capsule, Take 1 capsule (50,000 Units total) by mouth every 7 (seven) days.   Reviewed prior external information including notes and imaging from  primary care provider As well as notes that were available from care everywhere and other healthcare systems.  Past medical history, social, surgical and family history all reviewed in electronic medical record.  No pertanent information unless stated regarding to the chief complaint.   Review of Systems:  No headache, visual changes, nausea, vomiting, diarrhea, constipation, dizziness, abdominal pain, skin rash, fevers, chills, night sweats, weight loss, swollen lymph nodes, body aches, joint swelling, chest pain, shortness of breath, mood changes. POSITIVE muscle aches  Objective  There were no vitals taken for this visit.   General: No apparent distress alert and oriented x3 mood and affect normal, dressed appropriately.  HEENT: Pupils equal, extraocular movements intact  Respiratory: Patient's speak in full sentences and does not appear short of breath  Cardiovascular: No lower extremity edema, non tender, no erythema      Impression and Recommendations:

## 2022-01-09 ENCOUNTER — Encounter: Payer: Self-pay | Admitting: Family Medicine

## 2022-01-09 MED ORDER — ALLOPURINOL 300 MG PO TABS: 450.0000 mg | ORAL_TABLET | Freq: Every day | ORAL | 1 refills | Status: DC

## 2022-01-10 ENCOUNTER — Ambulatory Visit: Payer: 59 | Admitting: Family Medicine

## 2022-02-06 ENCOUNTER — Other Ambulatory Visit (INDEPENDENT_AMBULATORY_CARE_PROVIDER_SITE_OTHER): Payer: 59

## 2022-02-06 DIAGNOSIS — R7303 Prediabetes: Secondary | ICD-10-CM

## 2022-02-06 DIAGNOSIS — M1000 Idiopathic gout, unspecified site: Secondary | ICD-10-CM | POA: Diagnosis not present

## 2022-02-06 LAB — HEMOGLOBIN A1C: Hgb A1c MFr Bld: 5.9 % (ref 4.6–6.5)

## 2022-02-06 LAB — URIC ACID: Uric Acid, Serum: 5.3 mg/dL (ref 4.0–7.8)

## 2022-02-08 ENCOUNTER — Encounter: Payer: Self-pay | Admitting: Family Medicine

## 2022-02-08 ENCOUNTER — Ambulatory Visit: Payer: 59

## 2022-03-09 ENCOUNTER — Encounter: Payer: Self-pay | Admitting: Family Medicine

## 2022-03-10 ENCOUNTER — Other Ambulatory Visit: Payer: Self-pay

## 2022-03-10 ENCOUNTER — Encounter: Payer: Self-pay | Admitting: Family Medicine

## 2022-03-10 MED ORDER — SILDENAFIL CITRATE 50 MG PO TABS: ORAL_TABLET | ORAL | 6 refills | Status: DC

## 2022-03-10 NOTE — Telephone Encounter (Signed)
Patient has not received a refill since 08/08/21.  Please fill, if appropriate.

## 2022-06-12 ENCOUNTER — Encounter: Payer: Self-pay | Admitting: *Deleted

## 2022-08-10 ENCOUNTER — Encounter: Payer: 59 | Admitting: Family Medicine

## 2022-08-21 NOTE — Patient Instructions (Signed)

## 2022-08-22 ENCOUNTER — Encounter: Payer: Self-pay | Admitting: Family Medicine

## 2022-08-22 ENCOUNTER — Ambulatory Visit (INDEPENDENT_AMBULATORY_CARE_PROVIDER_SITE_OTHER): Payer: 59 | Admitting: Family Medicine

## 2022-08-22 VITALS — BP 113/76 | HR 61 | Ht 70.0 in | Wt 172.6 lb

## 2022-08-22 DIAGNOSIS — M109 Gout, unspecified: Secondary | ICD-10-CM

## 2022-08-22 DIAGNOSIS — R7989 Other specified abnormal findings of blood chemistry: Secondary | ICD-10-CM | POA: Diagnosis not present

## 2022-08-22 DIAGNOSIS — R7303 Prediabetes: Secondary | ICD-10-CM | POA: Diagnosis not present

## 2022-08-22 DIAGNOSIS — Z125 Encounter for screening for malignant neoplasm of prostate: Secondary | ICD-10-CM

## 2022-08-22 DIAGNOSIS — E559 Vitamin D deficiency, unspecified: Secondary | ICD-10-CM | POA: Diagnosis not present

## 2022-08-22 DIAGNOSIS — E538 Deficiency of other specified B group vitamins: Secondary | ICD-10-CM | POA: Diagnosis not present

## 2022-08-22 DIAGNOSIS — K1321 Leukoplakia of oral mucosa, including tongue: Secondary | ICD-10-CM

## 2022-08-22 DIAGNOSIS — Z0001 Encounter for general adult medical examination with abnormal findings: Secondary | ICD-10-CM | POA: Diagnosis not present

## 2022-08-22 DIAGNOSIS — Z13220 Encounter for screening for lipoid disorders: Secondary | ICD-10-CM

## 2022-08-22 DIAGNOSIS — Z0000 Encounter for general adult medical examination without abnormal findings: Secondary | ICD-10-CM

## 2022-08-22 LAB — LIPID PANEL
Cholesterol: 138 mg/dL (ref 0–200)
HDL: 47 mg/dL (ref >39.00)
LDL Cholesterol: 75 mg/dL (ref 0–99)
NonHDL: 90.55
Total CHOL/HDL Ratio: 3
Triglycerides: 79 mg/dL (ref 0.0–149.0)
VLDL: 15.8 mg/dL (ref 0.0–40.0)

## 2022-08-22 LAB — COMPREHENSIVE METABOLIC PANEL
ALT: 19 U/L (ref 0–53)
AST: 20 U/L (ref 0–37)
Albumin: 4.6 g/dL (ref 3.5–5.2)
Alkaline Phosphatase: 53 U/L (ref 39–117)
BUN: 15 mg/dL (ref 6–23)
CO2: 27 mEq/L (ref 19–32)
Calcium: 9.4 mg/dL (ref 8.4–10.5)
Chloride: 105 mEq/L (ref 96–112)
Creatinine, Ser: 1.05 mg/dL (ref 0.40–1.50)
GFR: 81.3 mL/min (ref >60.00)
Glucose, Bld: 82 mg/dL (ref 70–99)
Potassium: 4.2 mEq/L (ref 3.5–5.1)
Sodium: 141 mEq/L (ref 135–145)
Total Bilirubin: 1.1 mg/dL (ref 0.2–1.2)
Total Protein: 7 g/dL (ref 6.0–8.3)

## 2022-08-22 LAB — HEMOGLOBIN A1C: Hgb A1c MFr Bld: 5.6 % (ref 4.6–6.5)

## 2022-08-22 LAB — CBC
HCT: 42.7 % (ref 39.0–52.0)
Hemoglobin: 14.1 g/dL (ref 13.0–17.0)
MCHC: 32.9 g/dL (ref 30.0–36.0)
MCV: 88.3 fl (ref 78.0–100.0)
Platelets: 141 10*3/uL — ABNORMAL LOW (ref 150.0–400.0)
RBC: 4.84 Mil/uL (ref 4.22–5.81)
RDW: 14.3 % (ref 11.5–15.5)
WBC: 4.9 10*3/uL (ref 4.0–10.5)

## 2022-08-22 LAB — TESTOSTERONE: Testosterone: 144.95 ng/dL — ABNORMAL LOW (ref 300.00–890.00)

## 2022-08-22 LAB — TSH: TSH: 2.04 u[IU]/mL (ref 0.35–5.50)

## 2022-08-22 LAB — URIC ACID: Uric Acid, Serum: 7 mg/dL (ref 4.0–7.8)

## 2022-08-22 LAB — VITAMIN B12: Vitamin B-12: 636 pg/mL (ref 211–911)

## 2022-08-22 LAB — PSA: PSA: 0.69 ng/mL (ref 0.10–4.00)

## 2022-08-22 LAB — VITAMIN D 25 HYDROXY (VIT D DEFICIENCY, FRACTURES): VITD: 44.05 ng/mL (ref 30.00–100.00)

## 2022-08-22 MED ORDER — ALLOPURINOL 300 MG PO TABS: 450.0000 mg | ORAL_TABLET | Freq: Every day | ORAL | 3 refills | Status: DC

## 2022-08-22 NOTE — Progress Notes (Signed)
Office Note 08/22/2022  CC:  Chief Complaint  Patient presents with   Annual Exam    Pt is fasting   Patient is a 52 y.o. male who is here for annual health maintenance exam. A/P as of last visit: "1) Health maintenance exam: Reviewed age and gender appropriate health maintenance issues (prudent diet, regular exercise, health risks of tobacco and excessive alcohol, use of seatbelts, fire alarms in home, use of sunscreen).  Also reviewed age and gender appropriate health screening as well as vaccine recommendations. Vaccines: all UTD. Labs: HP, PSA, Hba1c (hx IFG) Prostate ca screening: PSA today Colon ca screening: recall 2032 (pt had non-adenomatous polyp 09/2020)   2) acute left knee pain. Question mild distal quad strain, plus minus MCL sprain.  Less suspicious for meniscal injury. Knee strengthening exercises, emphasized eccentric. Start running just gradually and build up distance and pace. No heavy deep knee squats.   3) gout. He has been without any gout flares for a long.  Now on allopurinol 1.5 of the 300 mg tabs daily.  We will check uric acid level today and if it remains below 6 we will cut back to 1 tab a day.   4) Resolving bronchitis.  Has appt to address his concern of tachycardia 08/24/21 with Dr. Jacinto Halim.  INTERIM HX: He is feeling well. No gout flares. His sports medicine physician diagnosed him with vitamin B12 deficiency, vitamin D deficiency, and low testosterone back in August last year.  He has been on DHEA supplement as well as B12 and vitamin D supplements since that time.  Concerns today: Pigmented lesion on left thigh, present since birth, itchy lately but no change in size or color or borders. Has little bumps palpable beneath the skin covering his abdomen, some on his arms, and a large 1 on his chest.  These do not hurt and have not grown significantly of late.  Hard white spot inner part of left cheek.  Past Medical History:  Diagnosis Date    Calcific Achilles tendonitis    left-->echo tx 2023   Colon cancer screening    pt chose cologuard 09/2019   ED (erectile dysfunction)    Exercise-induced tachycardia    Dr. Jacinto Halim w/u 2023 nl   Gout    2 attacks in right MTP joint around 2005; 2 more flares in foot/toe 2013.  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   Primary osteoarthritis of both knees    Left MRI 2023-->also med meniscus tear   Right shoulder pain 07/2015   Right AC synovitis (Dr. Pearletha Forge)   Seasonal allergies    Vitamin B12 deficiency    Vitamin D deficiency    Wrist pain    For 2-3 years in his 30s.  Extensive multidisciplinary w/u at [facility] and [facility] revealed NO ABNORMALITY.  Improved with ibuprofen and essentially has spontaneously resolved.    Past Surgical History:  Procedure Laterality Date   COLONOSCOPY W/ POLYPECTOMY  09/2020   2022 nonadenomatous   coronary calcium score  09/02/2021   ZERO   ETT  09/08/2021   NORMAL/LOW RISK (Dr. Jacinto Halim)   TRANSTHORACIC ECHOCARDIOGRAM  09/16/2021   normal    Family History  Problem Relation Age of Onset   Arthritis Mother        osteo (no gout)   Arthritis Father  osteo (no gout)   Colon polyps Neg Hx    Colon cancer Neg Hx    Esophageal cancer Neg Hx    Rectal cancer Neg Hx    Stomach cancer Neg Hx     Social History   Socioeconomic History   Marital status: Married    Spouse name: Not on file   Number of children: 2   Years of education: Not on file   Highest education level: Not on file  Occupational History   Not on file  Tobacco Use   Smoking status: Some Days    Types: Pipe, Cigars    Last attempt to quit: 06-Sep-2013    Years since quitting: 9.4   Smokeless tobacco: Never  Vaping Use   Vaping Use: Never used  Substance and Sexual Activity   Alcohol use: Yes    Alcohol/week: 4.0 standard drinks of alcohol    Types: 4  Standard drinks or equivalent per week    Comment: OCC   Drug use: No   Sexual activity: Not on file  Other Topics Concern   Not on file  Social History Narrative   Married, 2 daughters (3 y/o and 56 y/o).   Occupation: Sport and exercise psychologist.  Got Master's degree in [place] and also one at [employer].   Regular Exercise: 1-2 days per week (elliptical machine 30 min).  He is a vegetarian (tends to eat lots of lentils, bread, rice).     No cigarettes but occasional cigar/pipe.  Two alcohol drinks per week (beer or whisky).   Born in [place], moved to [place] in 09/07/94.  He is the youngest of 5 siblings.  All siblings healthy but one died in 07-Sep-1998 of a "stomach problem" rather suddenly.   ?BCG given in [place]?  Positive PPD here in September 07, 2003, CXR neg--no treatment.         Social Determinants of Health   Financial Resource Strain: Not on file  Food Insecurity: Not on file  Transportation Needs: Not on file  Physical Activity: Not on file  Stress: Not on file  Social Connections: Not on file  Intimate Partner Violence: Not on file    Outpatient Medications Prior to Visit  Medication Sig Dispense Refill   allopurinol (ZYLOPRIM) 300 MG tablet Take 1.5 tablets (450 mg total) by mouth daily. 135 tablet 1   sildenafil (VIAGRA) 50 MG tablet 1-2 tabs po qd prn.  Take prior to intercourse 30 tablet 6   omeprazole (PRILOSEC) 20 MG capsule Take 1 capsule (20 mg total) by mouth daily as needed. (Patient not taking: Reported on 08/22/2022) 90 capsule 0   Vitamin D, Ergocalciferol, (DRISDOL) 1.25 MG (50000 UNIT) CAPS capsule Take 1 capsule (50,000 Units total) by mouth every 7 (seven) days. (Patient not taking: Reported on 08/22/2022) 12 capsule 0   No facility-administered medications prior to visit.    No Known Allergies  Review of Systems  Constitutional:  Negative for appetite change, chills, fatigue and fever.  HENT:  Negative for congestion, dental problem, ear pain and sore throat.   Eyes:  Negative for  discharge, redness and visual disturbance.  Respiratory:  Negative for cough, chest tightness, shortness of breath and wheezing.   Cardiovascular:  Negative for chest pain, palpitations and leg swelling.  Gastrointestinal:  Negative for abdominal pain, blood in stool, diarrhea, nausea and vomiting.  Genitourinary:  Negative for difficulty urinating, dysuria, flank pain, frequency, hematuria and urgency.  Musculoskeletal:  Negative for arthralgias, back pain, joint swelling, myalgias  and neck stiffness.  Skin:  Negative for pallor and rash.  Neurological:  Negative for dizziness, speech difficulty, weakness and headaches.  Hematological:  Negative for adenopathy. Does not bruise/bleed easily.  Psychiatric/Behavioral:  Negative for confusion and sleep disturbance. The patient is not nervous/anxious.     PE;    08/22/2022   10:34 AM 11/15/2021    8:07 AM 10/28/2021    8:16 AM  Vitals with BMI  Height 5\' 10"  5\' 10"  5\' 10"   Weight 172 lbs 10 oz 175 lbs 181 lbs 3 oz  BMI 24.77 25.11 26  Systolic 113 118 562  Diastolic 76 82 86  Pulse 61 60 76   Gen: Alert, well appearing.  Patient is oriented to person, place, time, and situation. AFFECT: pleasant, lucid thought and speech. ENT: Ears: EACs clear, normal epithelium.  TMs with good light reflex and landmarks bilaterally.  Eyes: no injection, icteris, swelling, or exudate.  EOMI, PERRLA. Nose: no drainage or turbinate edema/swelling.  No injection or focal lesion.  Mouth: lips without lesion/swelling.  Oral mucosa pink and moist.  Dentition intact and without obvious caries or gingival swelling.  Oropharynx without erythema, exudate, or swelling.  Left buccal mucosa with 1 cm irregular leukoplakia lesion. Neck: supple/nontender.  No LAD, mass, or TM.  Carotid pulses 2+ bilaterally, without bruits. CV: RRR, no m/r/g.   LUNGS: CTA bilat, nonlabored resps, good aeration in all lung fields. ABD: soft, NT, ND, BS normal.  No hepatospenomegaly or mass.   No bruits. EXT: no clubbing, cyanosis, or edema.  Musculoskeletal: no joint swelling, erythema, warmth, or tenderness.  ROM of all joints intact. Skin - no sores or suspicious lesions or rashes or color changes Left thigh with 5 mm round pigmented papule with distinct borders and without pigment variation. He has about a dozen or so subcutaneous soft palpable nodules covering his abdomen and arms and a significantly larger 1 in the anterior chest wall.  These are mobile, nontender.  Pertinent labs:  Lab Results  Component Value Date   TSH 1.80 08/08/2021   Lab Results  Component Value Date   WBC 7.2 08/08/2021   HGB 13.2 08/08/2021   HCT 40.7 08/08/2021   MCV 85.9 08/08/2021   PLT 189.0 08/08/2021   Lab Results  Component Value Date   CREATININE 0.93 08/08/2021   BUN 11 08/08/2021   NA 140 08/08/2021   K 4.4 08/08/2021   CL 108 08/08/2021   CO2 25 08/08/2021   Lab Results  Component Value Date   ALT 14 08/08/2021   AST 14 08/08/2021   ALKPHOS 75 08/08/2021   BILITOT 0.4 08/08/2021   Lab Results  Component Value Date   CHOL 137 08/08/2021   Lab Results  Component Value Date   HDL 45.90 08/08/2021   Lab Results  Component Value Date   LDLCALC 74 08/08/2021   Lab Results  Component Value Date   TRIG 89.0 08/08/2021   Lab Results  Component Value Date   CHOLHDL 3 08/08/2021   Lab Results  Component Value Date   PSA 0.49 08/08/2021   PSA 0.54 08/06/2020   PSA 0.53 08/07/2019   Lab Results  Component Value Date   HGBA1C 5.9 02/06/2022   Lab Results  Component Value Date   LABURIC 5.3 02/06/2022   Lab Results  Component Value Date   VITAMINB12 169 (L) 09/29/2021   Last vitamin D Lab Results  Component Value Date   VD25OH 11.40 (L) 09/29/2021  Lab Results  Component Value Date   TESTOSTERONE 152.01 (L) 09/29/2021    ASSESSMENT AND PLAN:   #1 health maintenance exam: Reviewed age and gender appropriate health maintenance issues (prudent  diet, regular exercise, health risks of tobacco and excessive alcohol, use of seatbelts, fire alarms in home, use of sunscreen).  Also reviewed age and gender appropriate health screening as well as vaccine recommendations. Vaccines: all UTD. Labs: HP, PSA, Hba1c (hx IFG) Prostate ca screening: PSA today Colon ca screening: recall 2032 (pt had non-adenomatous polyp 09/2020).  #2 vitamin D and vitamin B12 deficiency, suspect mostly due to his vegetarian diet. He has been on supplement for several months now. Check B12 and vitamin D level today.  3.  Testosterone deficiency. He has been taking DHEA per sports medicine's recommendation. Recheck level today.  4.  Subcutaneous nodule consistent with small lipomas. Reassured.  5.  Left thigh pigmented skin lesion.  Has had this all his life and it has not changed. Reassured. Signs/symptoms to call or return for were reviewed and pt expressed understanding.  #6 oral leukoplakia, left buccal mucosa. Refer to oral surgeon ordered today.  #7 gout, well-controlled on allopurinol 450 mg daily. Check uric acid level today. Refilled medication.  An After Visit Summary was printed and given to the patient.  FOLLOW UP:  No follow-ups on file.  Signed:  Santiago Bumpers, MD           08/22/2022

## 2022-08-23 ENCOUNTER — Ambulatory Visit: Payer: 59

## 2022-08-23 ENCOUNTER — Other Ambulatory Visit: Payer: Self-pay

## 2022-08-23 DIAGNOSIS — R7989 Other specified abnormal findings of blood chemistry: Secondary | ICD-10-CM

## 2022-08-23 NOTE — Addendum Note (Signed)
Addended by: Barnet Glasgow on: 08/23/2022 04:17 PM   Modules accepted: Orders

## 2022-08-24 LAB — LUTEINIZING HORMONE: LH: 3.8 m[IU]/mL (ref 1.5–9.3)

## 2022-08-24 LAB — PROLACTIN: Prolactin: 7.8 ng/mL (ref 2.0–18.0)

## 2022-08-28 ENCOUNTER — Encounter: Payer: Self-pay | Admitting: Family Medicine

## 2022-08-28 DIAGNOSIS — E230 Hypopituitarism: Secondary | ICD-10-CM

## 2022-09-04 NOTE — Telephone Encounter (Signed)
I'll order MRI now. I'll order endocrinology referral.  Pls check on status of oral surgery referral I did on 08/22/22 for the white spot in his mouth. thx

## 2022-09-26 ENCOUNTER — Ambulatory Visit
Admission: RE | Admit: 2022-09-26 | Discharge: 2022-09-26 | Disposition: A | Discharge status: Home / Self Care | Payer: 59 | Source: Ambulatory Visit | Attending: Family Medicine | Admitting: Family Medicine

## 2022-09-26 DIAGNOSIS — E230 Hypopituitarism: Secondary | ICD-10-CM

## 2022-09-26 MED ORDER — GADOPICLENOL 0.5 MMOL/ML IV SOLN
7.5000 mL | Freq: Once | INTRAVENOUS | Status: AC | PRN
  Administered 2022-09-26: 7.5 mL via INTRAVENOUS

## 2022-09-28 ENCOUNTER — Telehealth: Payer: Self-pay | Admitting: Family Medicine

## 2022-09-28 ENCOUNTER — Encounter: Payer: Self-pay | Admitting: Family Medicine

## 2022-09-28 NOTE — Telephone Encounter (Signed)
Patient called to inquire about the results from his MRI. Informed the patient that the results had not been released yet. Please give the patient a call to update him on the status.

## 2022-09-28 NOTE — Telephone Encounter (Signed)
Pt called to check status of MRI results.

## 2022-10-02 ENCOUNTER — Encounter: Payer: Self-pay | Admitting: Family Medicine

## 2022-10-02 NOTE — Telephone Encounter (Signed)
Provider sent message to patient

## 2022-10-02 NOTE — Telephone Encounter (Signed)
[facility] Radiology for results to be read by radiologist. Sending to PCP as Lorain Childes

## 2022-10-05 DIAGNOSIS — R7989 Other specified abnormal findings of blood chemistry: Secondary | ICD-10-CM

## 2022-10-05 NOTE — Telephone Encounter (Signed)
Pt advised.

## 2022-10-05 NOTE — Telephone Encounter (Signed)
Hi Johnny Atkins, I want to reassure you that your platelet level is very near normal and is nothing to worry about. The endocrinologist will determine if anything further needs to be done to look into your low testosterone level and discuss any treatment options with you. ---PM

## 2022-10-27 NOTE — Telephone Encounter (Signed)
Okay, I ordered a new referral to Dr. Morrison Old today

## 2022-12-28 ENCOUNTER — Ambulatory Visit: Payer: 59 | Admitting: Endocrinology

## 2022-12-28 ENCOUNTER — Encounter: Payer: Self-pay | Admitting: Endocrinology

## 2022-12-28 VITALS — BP 116/78 | HR 94 | Resp 18 | Ht 70.0 in | Wt 180.6 lb

## 2022-12-28 DIAGNOSIS — R7989 Other specified abnormal findings of blood chemistry: Secondary | ICD-10-CM | POA: Diagnosis not present

## 2022-12-28 DIAGNOSIS — N529 Male erectile dysfunction, unspecified: Secondary | ICD-10-CM | POA: Diagnosis not present

## 2022-12-28 NOTE — Patient Instructions (Signed)
Please complete lab in the early morning 7-8 AM fasting.

## 2022-12-28 NOTE — Progress Notes (Addendum)
Outpatient Endocrinology Note [place] Paislie Tessler, MD  12/28/22  Patient's Name: Johnny Atkins    DOB: 10-30-1970    MRN: [id number]  REASON OF VISIT: New consult for evaluation of low testosterone.  REFERRING PROVIDER: Jeoffrey Massed, MD  PCP:  Jeoffrey Massed, MD  HISTORY OF PRESENT ILLNESS:   Johnny Atkins is a 52 y.o. old male with past medical history listed below, is here for evaluation of low testosterone.    Pertinent Hx: Patient was evaluated for low testosterone, was taking DHEA from his sport medicine and found to have total testosterone of 144 in the late morning in June 2024.  He had low total testosterone about 152 in the mid morning in August 2023.  In 2008 he had a low total testosterone of 182 and mildly elevated free testosterone around 9 PM.  In June 2024 LH in the afternoon was normal 3.8 and prolactin normal 7.8.  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  Patient is referred to endocrinology for further evaluation and management of low testosterone.  Patient was taking DHEA for several months including other vitamins including vitamin B12, vitamin D, vitamin B6, vitamin C from sports medicine.  He has knee meniscus injury problem.  He has no fractures.  He reports he stopped taking DHEA about 6 months ago.  He has complaints of erectile dysfunction and takes sildenafil.  He reports mildly low libido.  He has normal /usual energy level.    He has no diabetes had normal hemoglobin A1c 5.6% and normal thyroid function test in June 2024. He had normal PSA in the past.  He had normal liver enzymes.  Labs:  Latest Reference Range & Units 03/15/06 21:19 09/29/21 09:33 08/22/22 11:14  Sex Horm Binding Glob, Serum 13 - 71 nmol/L 8 (L)    Testosterone 300.00 - 890.00 ng/dL 409.81 (L)  191.47 (L) 144.95 (L)  Testosterone Free 47.0 - 244.0 pg/mL 60.7    Testosterone-% Free 1.6 - 2.9 % 3.3 (H)    (L): Data is abnormally low (H): Data is abnormally high   Latest Reference Range & Units 08/23/22 16:18  LH 1.5 - 9.3 mIU/mL 3.8  Prolactin 2.0 - 18.0 ng/mL 7.8    Libido:                          Mildly decreased  Erectile Dysfunction: Yes Gynecomastia:           No Fathered any child:     Yes, 14 and 21 years in 2024.  Shaving less often:     No Opioid use:                  No Decreased strength    No Decreased Muscle      No Tiredness/Fatigue       No Anabolic steroids:        No Weight lifting:              No Excessive exercise:    No Fractures:                    No Vision issues               No Sense of Smell  Intact Truama, Radiation, Mumps  No.  No head trauma.  No genital trauma.  Interval history 12/28/22 Patient presented to establish endocrinology care for evaluation of low testosterone.  REVIEW OF SYSTEMS:  As per history of present illness.   PAST MEDICAL HISTORY: Past Medical History:  Diagnosis Date   Calcific Achilles tendonitis    left-->echo tx 2023   Colon cancer screening    pt chose cologuard 09/2019   ED (erectile dysfunction)    Exercise-induced tachycardia    Dr. Jacinto Halim w/u 2023 nl   Gout    2 attacks in right MTP joint around 2005; 2 more flares in foot/toe 2013.  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   Primary osteoarthritis of both knees    Left MRI 2023-->also med meniscus tear   Right shoulder pain 07/2015   Right AC synovitis (Dr. Pearletha Forge)   Seasonal allergies    Vitamin B12 deficiency    Vitamin D deficiency    Wrist pain    For 2-3 years in his 30s.  Extensive multidisciplinary w/u at [facility] and [facility] revealed NO ABNORMALITY.  Improved with ibuprofen and essentially has spontaneously  resolved.    PAST SURGICAL HISTORY: Past Surgical History:  Procedure Laterality Date   COLONOSCOPY W/ POLYPECTOMY  09/2020   2022 nonadenomatous   coronary calcium score  09/02/2021   ZERO   ETT  09/08/2021   NORMAL/LOW RISK (Dr. Jacinto Halim)   TRANSTHORACIC ECHOCARDIOGRAM  09/16/2021   normal    ALLERGIES: No Known Allergies  FAMILY HISTORY:  Family History  Problem Relation Age of Onset   Arthritis Mother        osteo (no gout)   Arthritis Father        osteo (no gout)   Colon polyps Neg Hx    Colon cancer Neg Hx    Esophageal cancer Neg Hx    Rectal cancer Neg Hx    Stomach cancer Neg Hx     SOCIAL HISTORY: Social History   Socioeconomic History   Marital status: Married    Spouse name: Not on file   Number of children: 2   Years of education: Not on file   Highest education level: Not on file  Occupational History   Not on file  Tobacco Use   Smoking status: Some Days    Types: Pipe, Cigars    Last attempt to quit: 2015    Years since quitting: 9.8   Smokeless tobacco: Never  Vaping Use   Vaping status: Never Used  Substance and Sexual Activity   Alcohol use: Yes    Alcohol/week: 4.0 standard drinks of alcohol    Types: 4 Standard drinks or equivalent per week    Comment: OCC   Drug use: No   Sexual activity: Not on file  Other Topics Concern   Not on file  Social History Narrative   Married, 2 daughters (3 y/o and 53 y/o).   Occupation: Sport and exercise psychologist.  Got Master's degree in [place] and also one at [employer].   Regular Exercise: 1-2 days per week (elliptical machine 30 min).  He is a vegetarian (tends to eat lots of lentils, bread, rice).     No cigarettes but occasional cigar/pipe.  Two alcohol drinks per week (beer or whisky).   Born in [place], moved to [place] in 1996.  He  is the youngest of 5 siblings.  All siblings healthy but one died in 1999/01/24 of a "stomach problem" rather suddenly.   ?BCG given in [place]?  Positive PPD here in 01/24/04, CXR neg--no  treatment.         Social Determinants of Health   Financial Resource Strain: Not on file  Food Insecurity: Not on file  Transportation Needs: Not on file  Physical Activity: Not on file  Stress: Not on file  Social Connections: Not on file    MEDICATIONS:  Current Outpatient Medications  Medication Sig Dispense Refill   allopurinol (ZYLOPRIM) 300 MG tablet Take 1.5 tablets (450 mg total) by mouth daily. 135 tablet 3   sildenafil (VIAGRA) 50 MG tablet 1-2 tabs po qd prn.  Take prior to intercourse 30 tablet 6   Vitamin D, Ergocalciferol, (DRISDOL) 1.25 MG (50000 UNIT) CAPS capsule Take 1 capsule (50,000 Units total) by mouth every 7 (seven) days. 12 capsule 0   No current facility-administered medications for this visit.    PHYSICAL EXAM: Vitals:   12/28/22 1325  BP: 116/78  Pulse: 94  Resp: 18  SpO2: 95%  Weight: 180 lb 9.6 oz (81.9 kg)  Height: 5\' 10"  (1.778 m)   Body mass index is 25.91 kg/m.  Wt Readings from Last 3 Encounters:  12/28/22 180 lb 9.6 oz (81.9 kg)  08/22/22 172 lb 9.6 oz (78.3 kg)  11/15/21 175 lb (79.4 kg)    General: Well developed, well nourished male in no apparent distress. Non-Cushingoid. No obvious Klinefelter's syndrome body habitus HEENT: AT/[place], no external lesions. Hearing intact to the spoken word Eyes: EOMI. No proptosis, stare and lid lag. Conjunctiva clear and no icterus. Visual field grossly intact in confrontation Neck: Trachea midline, neck supple without appreciable thyromegaly or lymphadenopathy and no palpable thyroid nodules Lungs: Clear to auscultation, no wheeze. Respirations not labored Heart: S1S2, Regular in rate and rhythm.  Abdomen: Soft, non tender, non distended, no striae Neurologic: Alert, oriented, normal speech, deep tendon biceps reflexes normal,  no gross focal neurological deficit. No obvious proximal weakness Extremities: No pedal pitting edema, no tremors of outstretched hands, + excessive hair growth.  No  gynecomastia. Skin: Warm, color good.  Psychiatric: Does not appear depressed or anxious  PERTINENT HISTORIC LABORATORY AND IMAGING STUDIES:  All pertinent laboratory results were reviewed. Please see HPI also for further details.   ASSESSMENT / PLAN  1. Low testosterone in male   2. Erectile dysfunction, unspecified erectile dysfunction type    -Patient had low total testosterone, in the range of 144-152 with a lower normal limit of 300, checked in the mid to late morning.  He had normal LH.  He had normal prolactin.  MRI pituitary normal with no growth or mass in the pituitary or sellar area. -Patient has complaints of erectile dysfunction and mildly low libido and reports normal energy.  No identifiable risk factors for low testosterone at this time.  -He was on DHEA for several months, states stopped about 6 months ago , ?  Questionable possibility about suppression of endogenous testosterone from using DHEA for long-term.  -.Testosterone lab with on in the later part of the day in the past.  I would like to repeat testosterone and complete workup for low testosterone with the laboratory test as follows.  Testosterone labs are ideally done in the morning fasting.  Discussed that if low testosterone is related with DHEA use for long-term, it should get better over time however we  may have to wait for several months.  Plan: -I would like to check total and free testosterone, with gonadotropin LH, FSH -I would like to check prolactin, iron studies and cortisol -I would like to check sex hormone binding globulin.  Patient is asked to complete lab in the early morning 7 to 8 AM fasting.  Rest of the plan will make after the test results.  Will plan between repeating testosterone lab after few months 4 to 5 months versus starting testosterone therapy if the levels are low.  We discussed the benefits and side-effects of starting testosterone replacement.   Possible complications of  testosterone supplementation include: Progression of prostate CA/increase in PSA Decreases in HDL Acne Oily skin Testicular atrophy Gynecomastia Polycythemia Sleep apnea  Likely benefits of testosterone supplementation include improvements in: Strength and muscle mass Sense of well-being Sexual activity/ libido Energy  Decreased overall body fat  # Erectile dysfunction continue Viagra as needed.   Diagnoses and all orders for this visit:  Low testosterone in male -     Sex hormone binding globulin; Future -     Testosterone,Free and Total; Future -     Luteinizing hormone; Future -     Prolactin; Future -     Follicle stimulating hormone; Future -     Cortisol; Future -     Iron, TIBC and Ferritin Panel; Future  Erectile dysfunction, unspecified erectile dysfunction type    DISPOSITION Follow up in clinic in to be treated and based on the above plan.  All questions answered and patient verbalized understanding of the plan.  [place] Carlas Vandyne, MD [facility] Endocrinology [facility] [address], [address] [place], [place] [address] Phone # [phone]  At least part of this note was generated using voice recognition software. Inadvertent word errors may have occurred, which were not recognized during the proofreading process.  Addendum:  Labs reviewed total testosterone is mildly low, LH mildly low, FSH normal, prolactin normal, sex hormone binding globulin is low normal.  Result of free testosterone is awaited.   Labs reviewed total testosterone is mildly low, LH mildly low, FSH normal, prolactin normal, sex hormone binding globulin is low normal.  Normal Free testosterone.  Called and talked with patient. Will plan to repeat lab for total, bioavailable and free testosterone, LH, FSH in 4 months.   Latest Reference Range & Units 12/29/22 08:18  Iron 50 - 180 mcg/dL 65  TIBC 914 - 782 mcg/dL (calc) 956  %SAT 20 - 48 % (calc) 19 (L)  Ferritin 38 - 380  ng/mL 31 (L)  Cortisol, Plasma ug/dL 21.3  LH 0.86 - 5.78 mIU/mL 1.36 (L)  FSH 1.4 - 18.1 mIU/ML 5.3  Prolactin 2.0 - 18.0 ng/mL 9.7  Sex Horm Binding Glob, Serum 10 - 50 nmol/L 19  Testosterone 264 - 916 ng/dL 469 (L)  Testosterone Free 7.2 - 24.0 pg/mL 8.6  (L): Data is abnormally low

## 2022-12-29 ENCOUNTER — Other Ambulatory Visit (INDEPENDENT_AMBULATORY_CARE_PROVIDER_SITE_OTHER): Payer: 59

## 2022-12-29 DIAGNOSIS — R7989 Other specified abnormal findings of blood chemistry: Secondary | ICD-10-CM | POA: Diagnosis not present

## 2022-12-29 LAB — FOLLICLE STIMULATING HORMONE: FSH: 5.3 m[IU]/mL (ref 1.4–18.1)

## 2022-12-29 LAB — LUTEINIZING HORMONE: LH: 1.36 m[IU]/mL — ABNORMAL LOW (ref 1.50–9.30)

## 2022-12-29 LAB — CORTISOL: Cortisol, Plasma: 17.3 ug/dL

## 2022-12-30 LAB — IRON,TIBC AND FERRITIN PANEL
%SAT: 19 % — ABNORMAL LOW (ref 20–48)
Ferritin: 31 ng/mL — ABNORMAL LOW (ref 38–380)
Iron: 65 ug/dL (ref 50–180)
TIBC: 342 ug/dL (ref 250–425)

## 2022-12-30 LAB — PROLACTIN: Prolactin: 9.7 ng/mL (ref 2.0–18.0)

## 2022-12-30 LAB — SEX HORMONE BINDING GLOBULIN: Sex Hormone Binding: 19 nmol/L (ref 10–50)

## 2023-01-02 LAB — TESTOSTERONE,FREE AND TOTAL
Testosterone, Free: 8.6 pg/mL (ref 7.2–24.0)
Testosterone: 258 ng/dL — ABNORMAL LOW (ref 264–916)

## 2023-01-04 ENCOUNTER — Telehealth: Payer: Self-pay

## 2023-01-04 ENCOUNTER — Encounter: Payer: Self-pay | Admitting: Endocrinology

## 2023-01-04 ENCOUNTER — Other Ambulatory Visit: Payer: Self-pay | Admitting: Endocrinology

## 2023-01-04 DIAGNOSIS — R7989 Other specified abnormal findings of blood chemistry: Secondary | ICD-10-CM

## 2023-01-04 NOTE — Telephone Encounter (Signed)
-----   Message from [place] Thapa sent at 01/04/2023 10:21 AM EST ----- Called patient to discuss results.  No answer.  Left voice message to call back.  Please call patient and connect and I would like to talk with him as well.

## 2023-01-04 NOTE — Telephone Encounter (Signed)
Patient reached, transferred to MD

## 2023-01-04 NOTE — Progress Notes (Unsigned)
I placed order to repeat labs for testosterone in 4 months.  Talked with patient over the phone.  Please arrange for lab visit

## 2023-02-22 ENCOUNTER — Telehealth: Payer: Self-pay

## 2023-02-22 DIAGNOSIS — E349 Endocrine disorder, unspecified: Secondary | ICD-10-CM

## 2023-02-22 DIAGNOSIS — M1A00X0 Idiopathic chronic gout, unspecified site, without tophus (tophi): Secondary | ICD-10-CM

## 2023-02-22 DIAGNOSIS — E559 Vitamin D deficiency, unspecified: Secondary | ICD-10-CM

## 2023-02-22 DIAGNOSIS — Z125 Encounter for screening for malignant neoplasm of prostate: Secondary | ICD-10-CM

## 2023-02-22 DIAGNOSIS — E538 Deficiency of other specified B group vitamins: Secondary | ICD-10-CM

## 2023-02-22 NOTE — Telephone Encounter (Signed)
CRM message from 12/19 - patient has cpe appt on 08/23/23 with Dr. Milinda Cave. Patient requesting to have lab order prior to appt so that patient will have results during visit.  [phone]

## 2023-02-22 NOTE — Telephone Encounter (Signed)
Is this ok?

## 2023-02-23 NOTE — Telephone Encounter (Signed)
Yes, this is okay. All labs have been ordered

## 2023-02-23 NOTE — Telephone Encounter (Signed)
Pt scheduled for lab visit 

## 2023-06-20 ENCOUNTER — Telehealth (INDEPENDENT_AMBULATORY_CARE_PROVIDER_SITE_OTHER): Admitting: Family Medicine

## 2023-06-20 ENCOUNTER — Encounter: Payer: Self-pay | Admitting: Family Medicine

## 2023-06-20 VITALS — Wt 174.0 lb

## 2023-06-20 DIAGNOSIS — F40243 Fear of flying: Secondary | ICD-10-CM

## 2023-06-20 DIAGNOSIS — F5102 Adjustment insomnia: Secondary | ICD-10-CM

## 2023-06-20 MED ORDER — CLONAZEPAM 1 MG PO TABS: 1.0000 mg | ORAL_TABLET | Freq: Two times a day (BID) | ORAL | 0 refills | Status: AC | PRN

## 2023-06-20 NOTE — Progress Notes (Signed)
 Virtual Visit via Video Note  I connected with Johnny Atkins  on 06/20/23 at 11:20 AM EDT by a video enabled telemedicine application and verified that I am speaking with the correct person using two identifiers.  Location patient: [place] Location provider:work or home office Persons participating in the virtual visit: patient, provider  I discussed the limitations and requested verbal permission for telemedicine visit. The patient expressed understanding and agreed to proceed.   HPI: 53 year old male being seen today for discussion of insomnia medication. Johnny Atkins is traveling to [place] soon and that he will be there for a week.  The flight is very long and in the past he has benefited from clonazepam  to help him rest on the flight and while away.  He is going back home to visit family but it may be a stressful situation.  He has had clonazepam  for a similar scenario back in 2014 and says that it helped very well.  He has been feeling well.  ROS: See pertinent positives and negatives per HPI.  Past Medical History:  Diagnosis Date   Calcific Achilles tendonitis    left-->echo tx 2023   Colon cancer screening    pt chose cologuard 09/2019   ED (erectile dysfunction)    Exercise-induced tachycardia    Dr. Berry Bristol w/u 2023 nl   Gout    2 attacks in right MTP joint around 2005; 2 more flares in foot/toe 2013.  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   Primary osteoarthritis of both knees    Left MRI 2023-->also med meniscus tear   Right shoulder pain 07/2015   Right AC synovitis (Dr. Peggy Bowens)   Seasonal allergies    Vitamin B12 deficiency    Vitamin D  deficiency    Wrist pain    For 2-3 years in his 30s.  Extensive multidisciplinary w/u at [facility] and [facility] revealed NO ABNORMALITY.  Improved with ibuprofen and essentially has spontaneously resolved.    Past Surgical History:   Procedure Laterality Date   COLONOSCOPY W/ POLYPECTOMY  09/2020   2022 nonadenomatous   coronary calcium score  09/02/2021   ZERO   ETT  09/08/2021   NORMAL/LOW RISK (Dr. Berry Bristol)   TRANSTHORACIC ECHOCARDIOGRAM  09/16/2021   normal     Current Outpatient Medications:    allopurinol  (ZYLOPRIM ) 300 MG tablet, Take 1.5 tablets (450 mg total) by mouth daily., Disp: 135 tablet, Rfl: 3   sildenafil  (VIAGRA ) 50 MG tablet, 1-2 tabs po qd prn.  Take prior to intercourse, Disp: 30 tablet, Rfl: 6   Vitamin D , Ergocalciferol , (DRISDOL ) 1.25 MG (50000 UNIT) CAPS capsule, Take 1 capsule (50,000 Units total) by mouth every 7 (seven) days., Disp: 12 capsule, Rfl: 0  EXAM:  VITALS per patient if applicable:     06/20/2023   11:21 AM 12/28/2022    1:25 PM 08/22/2022   10:34 AM  Vitals with BMI  Height  5\' 10"  5\' 10"   Weight 174 lbs 180 lbs 10 oz 172 lbs 10 oz  BMI  25.91 24.77  Systolic  116 113  Diastolic  78 76  Pulse  94 61    GENERAL: alert, oriented, appears well and in no acute distress  HEENT: atraumatic, conjunttiva clear, no obvious abnormalities on inspection of external nose and ears  NECK: normal movements of the head and neck  LUNGS: on inspection  no signs of respiratory distress, breathing rate appears normal, no obvious gross SOB, gasping or wheezing  CV: no obvious cyanosis  MS: moves all visible extremities without noticeable abnormality  PSYCH/NEURO: pleasant and cooperative, no obvious depression or anxiety, speech and thought processing grossly intact  LABS: none today  ASSESSMENT AND PLAN:  Discussed the following assessment and plan:  Travel anxiety and stress. He has responded well to prn use of clonazepam  in the past.  I sent in 1 mg tabs today, 1 tab twice a day as needed, #20, no refill.  I discussed the assessment and treatment plan with the patient. The patient was provided an opportunity to ask questions and all were answered. The patient agreed  with the plan and demonstrated an understanding of the instructions.   F/u: keep appt for cpe set for June this year  Signed:  Arletha Lady, MD           06/20/2023

## 2023-08-20 ENCOUNTER — Other Ambulatory Visit: Payer: 59

## 2023-08-23 ENCOUNTER — Encounter: Payer: 59 | Admitting: Family Medicine

## 2023-08-27 ENCOUNTER — Other Ambulatory Visit (INDEPENDENT_AMBULATORY_CARE_PROVIDER_SITE_OTHER)

## 2023-08-27 DIAGNOSIS — M1A00X0 Idiopathic chronic gout, unspecified site, without tophus (tophi): Secondary | ICD-10-CM

## 2023-08-27 DIAGNOSIS — E349 Endocrine disorder, unspecified: Secondary | ICD-10-CM | POA: Diagnosis not present

## 2023-08-27 LAB — LIPID PANEL
Cholesterol: 144 mg/dL (ref 0–200)
HDL: 40.7 mg/dL (ref >39.00)
LDL Cholesterol: 89 mg/dL (ref 0–99)
NonHDL: 103.4
Total CHOL/HDL Ratio: 4
Triglycerides: 74 mg/dL (ref 0.0–149.0)
VLDL: 14.8 mg/dL (ref 0.0–40.0)

## 2023-08-27 LAB — COMPREHENSIVE METABOLIC PANEL WITH GFR
ALT: 16 U/L (ref 0–53)
AST: 16 U/L (ref 0–37)
Albumin: 4.5 g/dL (ref 3.5–5.2)
Alkaline Phosphatase: 66 U/L (ref 39–117)
BUN: 17 mg/dL (ref 6–23)
CO2: 26 meq/L (ref 19–32)
Calcium: 9.4 mg/dL (ref 8.4–10.5)
Chloride: 107 meq/L (ref 96–112)
Creatinine, Ser: 1.02 mg/dL (ref 0.40–1.50)
GFR: 83.58 mL/min (ref >60.00)
Glucose, Bld: 100 mg/dL — ABNORMAL HIGH (ref 70–99)
Potassium: 4.6 meq/L (ref 3.5–5.1)
Sodium: 140 meq/L (ref 135–145)
Total Bilirubin: 0.5 mg/dL (ref 0.2–1.2)
Total Protein: 6.7 g/dL (ref 6.0–8.3)

## 2023-08-27 LAB — TSH: TSH: 3.01 u[IU]/mL (ref 0.35–5.50)

## 2023-08-28 ENCOUNTER — Ambulatory Visit: Payer: Self-pay | Admitting: Family Medicine

## 2023-08-29 ENCOUNTER — Encounter: Payer: Self-pay | Admitting: Family Medicine

## 2023-08-29 ENCOUNTER — Ambulatory Visit: Admitting: Family Medicine

## 2023-08-29 VITALS — BP 113/73 | HR 78 | Ht 70.0 in | Wt 179.2 lb

## 2023-08-29 DIAGNOSIS — M1A00X0 Idiopathic chronic gout, unspecified site, without tophus (tophi): Secondary | ICD-10-CM | POA: Diagnosis not present

## 2023-08-29 DIAGNOSIS — E559 Vitamin D deficiency, unspecified: Secondary | ICD-10-CM

## 2023-08-29 DIAGNOSIS — Z0000 Encounter for general adult medical examination without abnormal findings: Secondary | ICD-10-CM

## 2023-08-29 DIAGNOSIS — Z23 Encounter for immunization: Secondary | ICD-10-CM

## 2023-08-29 DIAGNOSIS — E538 Deficiency of other specified B group vitamins: Secondary | ICD-10-CM

## 2023-08-29 DIAGNOSIS — Z125 Encounter for screening for malignant neoplasm of prostate: Secondary | ICD-10-CM | POA: Diagnosis not present

## 2023-08-29 DIAGNOSIS — E611 Iron deficiency: Secondary | ICD-10-CM

## 2023-08-29 DIAGNOSIS — E349 Endocrine disorder, unspecified: Secondary | ICD-10-CM

## 2023-08-29 MED ORDER — ALLOPURINOL 300 MG PO TABS: 450.0000 mg | ORAL_TABLET | Freq: Every day | ORAL | 3 refills | Status: AC

## 2023-08-29 MED ORDER — SILDENAFIL CITRATE 50 MG PO TABS: ORAL_TABLET | ORAL | 6 refills | Status: AC

## 2023-08-29 NOTE — Progress Notes (Signed)
 Office Note 08/29/2023  CC:  Chief Complaint  Patient presents with   Annual Exam    Pt is not fasting    HPI:  Patient is a 53 y.o. male who is here for annual health maintenance exam. Nature feels well. He works out regularly with elliptical and recumbent biking. No gout flares.  He saw the endocrinologist for his low testosterone  since I last saw him. Dr. Mercie felt like his low testosterone  could be due to previous DHEA supplementation. He recommended a repeat of his labs and patient wanted to wait until he came to see me to get these done.  Labs at that time also showed iron deficiency, dietary. He is not fasting today.   Past Medical History:  Diagnosis Date   Calcific Achilles tendonitis    left-->echo tx 2023   Colon cancer screening    pt chose cologuard 09/2019   ED (erectile dysfunction)    Exercise-induced tachycardia    Dr. Ladona w/u 2023 nl   Gout    2 attacks in right MTP joint around 2005; 2 more flares in foot/toe 2013.  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   Primary osteoarthritis of both knees    Left MRI 2023-->also med meniscus tear   Right shoulder pain 07/2015   Right AC synovitis (Dr. Cleatrice)   Seasonal allergies    Vitamin B12 deficiency    Vitamin D  deficiency    Wrist pain    For 2-3 years in his 30s.  Extensive multidisciplinary w/u at [facility] and [facility] revealed NO ABNORMALITY.  Improved with ibuprofen and essentially has spontaneously resolved.    Past Surgical History:  Procedure Laterality Date   COLONOSCOPY W/ POLYPECTOMY  09/2020   2022 nonadenomatous   coronary calcium score  09/02/2021   ZERO   ETT  09/08/2021   NORMAL/LOW RISK (Dr. Ladona)   TRANSTHORACIC ECHOCARDIOGRAM  09/16/2021   normal    Family History  Problem Relation Age of Onset   Arthritis Mother         osteo (no gout)   Arthritis Father        osteo (no gout)   Colon polyps Neg Hx    Colon cancer Neg Hx    Esophageal cancer Neg Hx    Rectal cancer Neg Hx    Stomach cancer Neg Hx     Social History   Socioeconomic History   Marital status: Married    Spouse name: Not on file   Number of children: 2   Years of education: Not on file   Highest education level: Not on file  Occupational History   Not on file  Tobacco Use   Smoking status: Some Days    Types: Pipe, Cigars    Last attempt to quit: 2015    Years since quitting: 10.5   Smokeless tobacco: Never  Vaping Use   Vaping status: Never Used  Substance and Sexual Activity   Alcohol use: Yes    Alcohol/week: 4.0 standard drinks of alcohol    Types: 4 Standard drinks or equivalent per week    Comment: OCC   Drug use: No   Sexual activity: Not on file  Other Topics Concern   Not on file  Social History Narrative   Married, 2 daughters (3 y/o  and 16 y/o).   Occupation: Sport and exercise psychologist.  Got Master's degree in [place] and also one at [employer].   Regular Exercise: 1-2 days per week (elliptical machine 30 min).  He is a vegetarian (tends to eat lots of lentils, bread, rice).     No cigarettes but occasional cigar/pipe.  Two alcohol drinks per week (beer or whisky).   Born in [place], moved to [place]  in [place].  He is the youngest of 5 siblings.  All siblings healthy but one died in September 25, 1998 of a stomach problem rather suddenly.   ?BCG given in [place]?  Positive PPD here in 09/25/2003, CXR neg--no treatment.         Social Drivers of Corporate investment banker Strain: Not on file  Food Insecurity: Not on file  Transportation Needs: Not on file  Physical Activity: Not on file  Stress: Not on file  Social Connections: Not on file  Intimate Partner Violence: Not on file    Outpatient Medications Prior to Visit  Medication Sig Dispense Refill   clonazePAM  (KLONOPIN ) 1 MG tablet Take 1 tablet (1 mg total) by mouth 2 (two) times  daily as needed for anxiety. 20 tablet 0   Vitamin D , Ergocalciferol , (DRISDOL ) 1.25 MG (50000 UNIT) CAPS capsule Take 1 capsule (50,000 Units total) by mouth every 7 (seven) days. 12 capsule 0   allopurinol  (ZYLOPRIM ) 300 MG tablet Take 1.5 tablets (450 mg total) by mouth daily. 135 tablet 3   sildenafil  (VIAGRA ) 50 MG tablet 1-2 tabs po qd prn.  Take prior to intercourse 30 tablet 6   No facility-administered medications prior to visit.    No Known Allergies  Review of Systems  Constitutional:  Negative for appetite change, chills, fatigue and fever.  HENT:  Negative for congestion, dental problem, ear pain and sore throat.   Eyes:  Negative for discharge, redness and visual disturbance.  Respiratory:  Negative for cough, chest tightness, shortness of breath and wheezing.   Cardiovascular:  Negative for chest pain, palpitations and leg swelling.  Gastrointestinal:  Negative for abdominal pain, blood in stool, diarrhea, nausea and vomiting.  Genitourinary:  Negative for difficulty urinating, dysuria, flank pain, frequency, hematuria and urgency.  Musculoskeletal:  Negative for arthralgias, back pain, joint swelling, myalgias and neck stiffness.  Skin:  Negative for pallor and rash.  Neurological:  Negative for dizziness, speech difficulty, weakness and headaches.  Hematological:  Negative for adenopathy. Does not bruise/bleed easily.  Psychiatric/Behavioral:  Negative for confusion and sleep disturbance. The patient is not nervous/anxious.     PE;    08/29/2023    1:07 PM 06/20/2023   11:21 AM 12/28/2022    1:25 PM  Vitals with BMI  Height 5' 10  5' 10  Weight 179 lbs 3 oz 174 lbs 180 lbs 10 oz  BMI 25.71  25.91  Systolic 113  116  Diastolic 73  78  Pulse 78  94    Gen: Alert, well appearing.  Patient is oriented to person, place, time, and situation. AFFECT: pleasant, lucid thought and speech. ENT: Ears: EACs clear, normal epithelium.  TMs with good light reflex and  landmarks bilaterally.  Eyes: no injection, icteris, swelling, or exudate.  EOMI, PERRLA. Nose: no drainage or turbinate edema/swelling.  No injection or focal lesion.  Mouth: lips without lesion/swelling.  Oral mucosa pink and moist.  Dentition intact and without obvious caries or gingival swelling.  Oropharynx without erythema, exudate, or swelling.  Neck: supple/nontender.  No  LAD, mass, or TM.  Carotid pulses 2+ bilaterally, without bruits. CV: RRR, no m/r/g.   LUNGS: CTA bilat, nonlabored resps, good aeration in all lung fields. ABD: soft, NT, ND, BS normal.  No hepatospenomegaly or mass.  No bruits. EXT: no clubbing, cyanosis, or edema.  Musculoskeletal: no joint swelling, erythema, warmth, or tenderness.  ROM of all joints intact. Skin - no sores or suspicious lesions or rashes or color changes He has scattered small lipomatous nodules palpable over his trunk and extremities.  Pertinent labs:  Lab Results  Component Value Date   TSH 3.01 08/27/2023   Lab Results  Component Value Date   WBC 4.9 08/22/2022   HGB 14.1 08/22/2022   HCT 42.7 08/22/2022   MCV 88.3 08/22/2022   PLT 141.0 (L) 08/22/2022   Lab Results  Component Value Date   IRON 65 12/29/2022   TIBC 342 12/29/2022   FERRITIN 31 (L) 12/29/2022   Lab Results  Component Value Date   CREATININE 1.02 08/27/2023   BUN 17 08/27/2023   NA 140 08/27/2023   K 4.6 08/27/2023   CL 107 08/27/2023   CO2 26 08/27/2023   Lab Results  Component Value Date   ALT 16 08/27/2023   AST 16 08/27/2023   ALKPHOS 66 08/27/2023   BILITOT 0.5 08/27/2023   Lab Results  Component Value Date   CHOL 144 08/27/2023   Lab Results  Component Value Date   HDL 40.70 08/27/2023   Lab Results  Component Value Date   LDLCALC 89 08/27/2023   Lab Results  Component Value Date   TRIG 74.0 08/27/2023   Lab Results  Component Value Date   CHOLHDL 4 08/27/2023   Lab Results  Component Value Date   PSA 0.69 08/22/2022   PSA 0.49  08/08/2021   PSA 0.54 08/06/2020   Lab Results  Component Value Date   HGBA1C 5.6 08/22/2022   Lab Results  Component Value Date   TESTOSTERONE  258 (L) 12/29/2022   Lab Results  Component Value Date   LABURIC 7.0 08/22/2022   Lab Results  Component Value Date   VITAMINB12 636 08/22/2022   ASSESSMENT AND PLAN:   No problem-specific Assessment & Plan notes found for this encounter.  #1 health maintenance exam: Reviewed age and gender appropriate health maintenance issues (prudent diet, regular exercise, health risks of tobacco and excessive alcohol, use of seatbelts, fire alarms in home, use of sunscreen).  Also reviewed age and gender appropriate health screening as well as vaccine recommendations. Vaccines: Prevnar 20 today.  Tdap UTD. Labs: [facility] labs 08/27/23 reviewed with him today (all normal).  Prostate ca screening: PSA ordered. Colon ca screening: recall 2032 (pt had non-adenomatous polyp 09/2020).  #2 low testosterone . Endocrinology felt like this was likely DHEA supplementation-induced. He feels better off of the supplementation. Plan repeat fasting early morning testosterone  tomorrow.  #3 history of vitamin D  and B12 deficiencies. Checking levels with upcoming labs.  An After Visit Summary was printed and given to the patient.  FOLLOW UP:  Return in about 1 year (around 08/28/2024) for annual CPE (fasting).  Signed:  Gerlene Hockey, MD           08/29/2023

## 2023-08-30 ENCOUNTER — Other Ambulatory Visit (INDEPENDENT_AMBULATORY_CARE_PROVIDER_SITE_OTHER)

## 2023-08-30 DIAGNOSIS — E349 Endocrine disorder, unspecified: Secondary | ICD-10-CM

## 2023-08-30 DIAGNOSIS — E611 Iron deficiency: Secondary | ICD-10-CM

## 2023-08-30 DIAGNOSIS — E538 Deficiency of other specified B group vitamins: Secondary | ICD-10-CM

## 2023-08-30 DIAGNOSIS — Z125 Encounter for screening for malignant neoplasm of prostate: Secondary | ICD-10-CM

## 2023-08-30 DIAGNOSIS — E559 Vitamin D deficiency, unspecified: Secondary | ICD-10-CM

## 2023-08-30 DIAGNOSIS — M1A00X0 Idiopathic chronic gout, unspecified site, without tophus (tophi): Secondary | ICD-10-CM | POA: Diagnosis not present

## 2023-08-30 LAB — VITAMIN D 25 HYDROXY (VIT D DEFICIENCY, FRACTURES): VITD: 34.96 ng/mL (ref 30.00–100.00)

## 2023-08-30 LAB — CBC WITH DIFFERENTIAL/PLATELET
Basophils Absolute: 0 10*3/uL (ref 0.0–0.1)
Basophils Relative: 0.6 % (ref 0.0–3.0)
Eosinophils Absolute: 0.2 10*3/uL (ref 0.0–0.7)
Eosinophils Relative: 3.7 % (ref 0.0–5.0)
HCT: 42.6 % (ref 39.0–52.0)
Hemoglobin: 13.9 g/dL (ref 13.0–17.0)
Lymphocytes Relative: 31.4 % (ref 12.0–46.0)
Lymphs Abs: 1.7 10*3/uL (ref 0.7–4.0)
MCHC: 32.7 g/dL (ref 30.0–36.0)
MCV: 86.6 fl (ref 78.0–100.0)
Monocytes Absolute: 0.5 10*3/uL (ref 0.1–1.0)
Monocytes Relative: 8.5 % (ref 3.0–12.0)
Neutro Abs: 3 10*3/uL (ref 1.4–7.7)
Neutrophils Relative %: 55.8 % (ref 43.0–77.0)
Platelets: 153 10*3/uL (ref 150.0–400.0)
RBC: 4.92 Mil/uL (ref 4.22–5.81)
RDW: 14 % (ref 11.5–15.5)
WBC: 5.3 10*3/uL (ref 4.0–10.5)

## 2023-08-30 LAB — PSA: PSA: 0.68 ng/mL (ref 0.10–4.00)

## 2023-08-30 LAB — LUTEINIZING HORMONE: LH: 4.13 m[IU]/mL (ref 1.50–9.30)

## 2023-08-30 LAB — URIC ACID: Uric Acid, Serum: 7.9 mg/dL — ABNORMAL HIGH (ref 4.0–7.8)

## 2023-08-30 LAB — VITAMIN B12: Vitamin B-12: 315 pg/mL (ref 211–911)

## 2023-08-30 LAB — TESTOSTERONE: Testosterone: 196.43 ng/dL — ABNORMAL LOW (ref 300.00–890.00)

## 2023-08-31 LAB — IRON,TIBC AND FERRITIN PANEL
%SAT: 38 % (ref 20–48)
Ferritin: 31 ng/mL — ABNORMAL LOW (ref 38–380)
Iron: 126 ug/dL (ref 50–180)
TIBC: 329 ug/dL (ref 250–425)

## 2023-09-03 ENCOUNTER — Ambulatory Visit: Payer: Self-pay | Admitting: Family Medicine

## 2024-03-04 ENCOUNTER — Ambulatory Visit: Admitting: Family Medicine

## 2024-03-04 ENCOUNTER — Encounter: Payer: Self-pay | Admitting: Family Medicine

## 2024-03-04 VITALS — BP 128/82 | HR 84 | Temp 98.9°F | Ht 70.0 in | Wt 188.0 lb

## 2024-03-04 DIAGNOSIS — R509 Fever, unspecified: Secondary | ICD-10-CM | POA: Insufficient documentation

## 2024-03-04 DIAGNOSIS — R051 Acute cough: Secondary | ICD-10-CM | POA: Insufficient documentation

## 2024-03-04 MED ORDER — PREDNISONE 50 MG PO TABS: ORAL_TABLET | ORAL | 0 refills | Status: AC

## 2024-03-04 MED ORDER — AZITHROMYCIN 250 MG PO TABS: ORAL_TABLET | ORAL | 0 refills | Status: AC

## 2024-03-04 MED ORDER — HYDROCOD POLI-CHLORPHE POLI ER 10-8 MG/5ML PO SUER: 5.0000 mL | Freq: Every evening | ORAL | 0 refills | Status: AC | PRN

## 2024-03-04 MED ORDER — ALBUTEROL SULFATE HFA 108 (90 BASE) MCG/ACT IN AERS: 2.0000 | INHALATION_SPRAY | Freq: Four times a day (QID) | RESPIRATORY_TRACT | 0 refills | Status: AC | PRN

## 2024-03-04 NOTE — Progress Notes (Signed)
 "  Acute Office Visit  Subjective:     Patient ID: Johnny Atkins, male    DOB: 1970-05-03, 54 y.o.   MRN: [id number]  Chief Complaint  Patient presents with   Cough    X 3 days    HPI Patient is in today for cough, runny nose, fever. T-max: 102.0 Has not tested at  home for flu or Covid. Cough with productive sputum. Pain with coughing.  Symptoms started Saturday morning, cough started Sunday.  Exposed to influenza around the holidays.  Endorses wheezing and pain with coughing in the back.   ROS      Objective:    BP 128/82 (BP Location: Left Arm, Patient Position: Sitting, Cuff Size: Large)   Pulse 84   Temp 98.9 F (37.2 C) (Oral)   Ht 5' 10 (1.778 m)   Wt 188 lb (85.3 kg)   SpO2 97%   BMI 26.98 kg/m    Physical Exam Vitals and nursing note reviewed.  Constitutional:      General: He is not in acute distress.    Appearance: Normal appearance. He is ill-appearing.  HENT:     Right Ear: Tympanic membrane normal.     Left Ear: Tympanic membrane normal.     Nose:     Right Sinus: No maxillary sinus tenderness or frontal sinus tenderness.     Left Sinus: No maxillary sinus tenderness or frontal sinus tenderness.     Mouth/Throat:     Pharynx: Uvula midline. No oropharyngeal exudate or posterior oropharyngeal erythema.  Cardiovascular:     Heart sounds: Normal heart sounds.  Pulmonary:     Effort: Pulmonary effort is normal.     Breath sounds: Normal breath sounds.  Skin:    General: Skin is warm and dry.  Neurological:     General: No focal deficit present.     Mental Status: He is alert. Mental status is at baseline.  Psychiatric:        Mood and Affect: Mood normal.        Behavior: Behavior normal.        Thought Content: Thought content normal.        Judgment: Judgment normal.     No results found for any visits on 03/04/24.      Assessment & Plan:   Problem List Items Addressed This Visit     Fever - Primary   Relevant Medications    azithromycin  (ZITHROMAX ) 250 MG tablet   Acute cough   Patient is in today for cough, runny nose, fever. T-max: 102.0 Has not tested at  home for flu or Covid. Cough with productive sputum. Pain with coughing.  Symptoms started Saturday morning, cough started Sunday.  Exposed to influenza around the holidays.  Endorses wheezing and pain with coughing in the back.  Plan: Azithromycin  500 mg today, then 250 mg daily for 4 days.  Prednisone  50 mg daily for 5 days. Albuterol  inhaler as needed for wheezing. Tussionex 10-8 mg/ 5 ml as needed at night for cough. Follow-up with PCP as needed.          Relevant Medications   predniSONE  (DELTASONE ) 50 MG tablet   albuterol  (VENTOLIN  HFA) 108 (90 Base) MCG/ACT inhaler   chlorpheniramine-HYDROcodone  (TUSSIONEX) 10-8 MG/5ML    Meds ordered this encounter  Medications   azithromycin  (ZITHROMAX ) 250 MG tablet    Sig: Take 2 tablets on day 1, then 1 tablet daily on days 2 through 5    Dispense:  6 tablet    Refill:  0    Supervising Provider:   METHENEY, CATHERINE D [[id number]]   predniSONE  (DELTASONE ) 50 MG tablet    Sig: Take one tablet by mouth daily for 5 days.    Dispense:  5 tablet    Refill:  0    Supervising Provider:   METHENEY, CATHERINE D [[id number]]   albuterol  (VENTOLIN  HFA) 108 (90 Base) MCG/ACT inhaler    Sig: Inhale 2 puffs into the lungs every 6 (six) hours as needed for wheezing or shortness of breath.    Dispense:  8 g    Refill:  0    Supervising Provider:   METHENEY, CATHERINE D [[id number]]   chlorpheniramine-HYDROcodone  (TUSSIONEX) 10-8 MG/5ML    Sig: Take 5 mLs by mouth at bedtime as needed for cough.    Dispense:  35 mL    Refill:  0    Supervising Provider:   METHENEY, CATHERINE D [[id number]]  Agrees with plan of care discussed.  Questions answered.   Return if symptoms worsen or fail to improve.  [name], FNP   "

## 2024-03-04 NOTE — Assessment & Plan Note (Signed)
 Patient is in today for cough, runny nose, fever. T-max: 102.0 Has not tested at  home for flu or Covid. Cough with productive sputum. Pain with coughing.  Symptoms started Saturday morning, cough started Sunday.  Exposed to influenza around the holidays.  Endorses wheezing and pain with coughing in the back.  Plan: Azithromycin  500 mg today, then 250 mg daily for 4 days.  Prednisone  50 mg daily for 5 days. Albuterol  inhaler as needed for wheezing. Tussionex 10-8 mg/ 5 ml as needed at night for cough. Follow-up with PCP as needed.

## 2024-08-19 ENCOUNTER — Other Ambulatory Visit: Payer: 59
